# Patient Record
Sex: Male | Born: 1940 | Race: White | Hispanic: No | Smoking: Never smoker
Health system: Southern US, Community
[De-identification: ages and names within clinical notes are randomized; demographics above are authoritative.]

## PROBLEM LIST (undated history)

## (undated) DIAGNOSIS — M47812 Spondylosis without myelopathy or radiculopathy, cervical region: Secondary | ICD-10-CM

## (undated) DIAGNOSIS — J449 Chronic obstructive pulmonary disease, unspecified: Secondary | ICD-10-CM

## (undated) DIAGNOSIS — F419 Anxiety disorder, unspecified: Secondary | ICD-10-CM

## (undated) DIAGNOSIS — N200 Calculus of kidney: Secondary | ICD-10-CM

## (undated) DIAGNOSIS — E785 Hyperlipidemia, unspecified: Secondary | ICD-10-CM

## (undated) DIAGNOSIS — C439 Malignant melanoma of skin, unspecified: Secondary | ICD-10-CM

## (undated) DIAGNOSIS — I10 Essential (primary) hypertension: Secondary | ICD-10-CM

## (undated) DIAGNOSIS — R9431 Abnormal electrocardiogram [ECG] [EKG]: Secondary | ICD-10-CM

## (undated) DIAGNOSIS — K219 Gastro-esophageal reflux disease without esophagitis: Secondary | ICD-10-CM

## (undated) DIAGNOSIS — I2510 Atherosclerotic heart disease of native coronary artery without angina pectoris: Secondary | ICD-10-CM

## (undated) HISTORY — DX: Calculus of kidney: N20.0

## (undated) HISTORY — DX: Essential (primary) hypertension: I10

## (undated) HISTORY — PX: BASAL CELL CARCINOMA EXCISION: SHX1214

## (undated) HISTORY — PX: KIDNEY STONE SURGERY: SHX686

## (undated) HISTORY — PX: MELANOMA EXCISION: SHX5266

## (undated) HISTORY — DX: Spondylosis without myelopathy or radiculopathy, cervical region: M47.812

## (undated) HISTORY — DX: Abnormal electrocardiogram (ECG) (EKG): R94.31

## (undated) HISTORY — DX: Gastro-esophageal reflux disease without esophagitis: K21.9

## (undated) HISTORY — DX: Atherosclerotic heart disease of native coronary artery without angina pectoris: I25.10

## (undated) HISTORY — DX: Hyperlipidemia, unspecified: E78.5

## (undated) HISTORY — DX: Malignant melanoma of skin, unspecified: C43.9

## (undated) HISTORY — PX: LUMBAR DISC SURGERY: SHX700

## (undated) HISTORY — PX: HERNIA REPAIR: SHX51

## (undated) HISTORY — PX: CARDIAC CATHETERIZATION: SHX172

## (undated) HISTORY — PX: TONSILLECTOMY: SUR1361

---

## 2000-02-22 ENCOUNTER — Ambulatory Visit (HOSPITAL_COMMUNITY): Admission: RE | Admit: 2000-02-22 | Discharge: 2000-02-22 | Payer: Self-pay | Admitting: Cardiology

## 2001-03-05 ENCOUNTER — Other Ambulatory Visit: Admission: RE | Admit: 2001-03-05 | Discharge: 2001-03-05 | Payer: Self-pay | Admitting: Family Medicine

## 2006-04-01 ENCOUNTER — Ambulatory Visit: Payer: Self-pay | Admitting: Internal Medicine

## 2006-05-01 ENCOUNTER — Ambulatory Visit: Payer: Self-pay | Admitting: Internal Medicine

## 2007-08-27 ENCOUNTER — Encounter: Payer: Self-pay | Admitting: Internal Medicine

## 2007-08-27 DIAGNOSIS — J45998 Other asthma: Secondary | ICD-10-CM | POA: Insufficient documentation

## 2007-08-27 DIAGNOSIS — J3089 Other allergic rhinitis: Secondary | ICD-10-CM

## 2007-08-27 DIAGNOSIS — J4489 Other specified chronic obstructive pulmonary disease: Secondary | ICD-10-CM | POA: Insufficient documentation

## 2007-08-27 DIAGNOSIS — J302 Other seasonal allergic rhinitis: Secondary | ICD-10-CM

## 2007-08-27 DIAGNOSIS — J449 Chronic obstructive pulmonary disease, unspecified: Secondary | ICD-10-CM

## 2007-11-11 ENCOUNTER — Ambulatory Visit: Payer: Self-pay | Admitting: Internal Medicine

## 2008-11-16 ENCOUNTER — Ambulatory Visit: Payer: Self-pay | Admitting: Internal Medicine

## 2009-01-12 ENCOUNTER — Encounter: Admission: RE | Admit: 2009-01-12 | Discharge: 2009-01-12 | Payer: Self-pay | Admitting: Cardiology

## 2009-01-12 ENCOUNTER — Ambulatory Visit: Payer: Self-pay | Admitting: Vascular Surgery

## 2009-03-14 ENCOUNTER — Encounter: Payer: Self-pay | Admitting: Internal Medicine

## 2009-05-09 ENCOUNTER — Encounter: Admission: RE | Admit: 2009-05-09 | Discharge: 2009-08-07 | Payer: Self-pay | Admitting: Family Medicine

## 2009-06-12 ENCOUNTER — Inpatient Hospital Stay (HOSPITAL_COMMUNITY): Admission: RE | Admit: 2009-06-12 | Discharge: 2009-06-13 | Payer: Self-pay | Admitting: Neurological Surgery

## 2009-07-05 ENCOUNTER — Encounter: Payer: Self-pay | Admitting: Internal Medicine

## 2009-08-08 ENCOUNTER — Encounter: Admission: RE | Admit: 2009-08-08 | Discharge: 2009-11-06 | Payer: Self-pay | Admitting: Family Medicine

## 2009-11-16 ENCOUNTER — Telehealth: Payer: Self-pay | Admitting: Internal Medicine

## 2009-11-22 ENCOUNTER — Encounter: Payer: Self-pay | Admitting: Internal Medicine

## 2009-11-22 ENCOUNTER — Ambulatory Visit: Payer: Self-pay | Admitting: Internal Medicine

## 2009-12-07 ENCOUNTER — Encounter: Payer: Self-pay | Admitting: Internal Medicine

## 2009-12-07 LAB — CONVERTED CEMR LAB

## 2009-12-19 ENCOUNTER — Telehealth (INDEPENDENT_AMBULATORY_CARE_PROVIDER_SITE_OTHER): Payer: Self-pay | Admitting: *Deleted

## 2010-02-05 ENCOUNTER — Encounter: Admission: RE | Admit: 2010-02-05 | Discharge: 2010-02-05 | Payer: Self-pay | Admitting: Neurological Surgery

## 2010-02-07 ENCOUNTER — Encounter
Admission: RE | Admit: 2010-02-07 | Discharge: 2010-05-03 | Payer: Self-pay | Source: Home / Self Care | Attending: Neurological Surgery | Admitting: Neurological Surgery

## 2010-06-05 NOTE — Progress Notes (Signed)
Summary: nos appt  Phone Note Call from Patient   Caller: [email or web address]  Call For: Rita Prom Summary of Call: Rsc nos from 7/13 to 7/20 @ 9:45a, pt states he simply forgot and he asks that you "please forgive him." Initial call taken by: Darletta Moll,  November 16, 2009 9:21 AM

## 2010-06-05 NOTE — Assessment & Plan Note (Signed)
Summary: rov/jd   Primary Provider/Referring Provider:  [facility]  CC:  Follow up visit-asthma, allergies, and COPD.Marland Kitchen  History of Present Illness: History of Present Illness: 11/11/07- 70 year old man returns for follow-up of allergic rhinitis and asthma with COPD.  He reports a good year.  Follow-up with no major problems.  He is a Sport and exercise psychologist.  Ventolin helps.  Had follow-up cardiac evaluation and is told he had  small blockages, mild cardiac enlargement.  Pulmonary function tests in 2700, showing small airways obstruction.  He has never smoked, but has history of passive smoke exposure.  Brother and father both died of lung cancer.  He tries to walk 5 days per week on a treadmill.  Asks renewal of his Henreitta Leber to Access ventolin support.  11/16/08- allergic trhinitis, asthma/ COPD Uneventful year from respiratory standpoint. Recent trip to [place] left him with a little throat clearing, and his singing is affected with voice breaks and difficulty at higher registers, but not sore throat, headache, sinus drainage, fever, wheeze, reflux. He does feel some pndrip.Marland Kitchen He golfs and is on treadmill several days a week.  Wife wanted him to mention that he falls asleep easily watching TV. He feels he sleeps well, averaging 7- 8 hrs sleep. Not told that he is active in sleep or that he snores.  05-Dec-2009- Allergic rhinitis, asthma/ COPD Breathing has done well. Had surgery for lumbar disk - anesthesia made his mood  depressed,  but had no respiratory complication. Had vocal cords checked by Dr Georgette Dover- hx of singing. Told vocal cords ok. He shows me "Entertainer's Secret" throat spray with aloe that looks benign. It also helps him to use the Ventolin about 2 hours before he sings.     Asthma History    Initial Asthma Severity Rating:    Age range: 12+ years    Symptoms: 0-2 days/week    Nighttime Awakenings: 0-2/month    Interferes w/ normal activity: no limitations    SABA use (not for  EIB): 0-2 days/week    Asthma Severity Assessment: Intermittent   Preventive Screening-Counseling & Management  Alcohol-Tobacco     Smoking Status: never  Current Medications (verified): 1)  Avapro 300 Mg  Tabs (Irbesartan) .... 1/2 By Mouth Once Daily 2)  Nexium 40 Mg  Cpdr (Esomeprazole Magnesium) .... Once Daily 3)  Zyrtec Allergy 10 Mg  Tabs (Cetirizine Hcl) .... Once Daily 4)  Celebrex 200 Mg  Caps (Celecoxib) .... Once Daily 5)  Cardura 4 Mg  Tabs (Doxazosin Mesylate) .... Once Daily 6)  Adult Aspirin Low Strength 81 Mg  Tbdp (Aspirin) .... Take 2 Tablets By Mouth Once Daily 7)  Niaspan 500 Mg  Tbcr (Niacin (Antihyperlipidemic)) .... Take One Tablet By Mouth Once Daily With Asa 8)  Ventolin Hfa 108 (90 Base) Mcg/act  Aers (Albuterol Sulfate) .... As Needed 9)  Crestor 5 Mg Tabs (Rosuvastatin Calcium) .... Take 1 By Mouth Once Daily 4 Times A Week  Allergies (verified): No Known Drug Allergies  Past History:  Past Medical History: Last updated: 08/27/2007 ALLERGIC RHINITIS (ICD-477.9) COPD (ICD-496) ASTHMA (ICD-493.90)  Family History: Last updated: December 05, 2009 Brother- smoked- died of lung cancer Father- died met prostate cancer.  Social History: Last updated: 11/11/2007 Patient never smoked.  Positive history of passive tobacco smoke exposure.   Risk Factors: Smoking Status: never (2009/12/05) Passive Smoke Exposure: yes (11/11/2007)  Past Surgical History: Tonsils removed Hernia repair Colonoscopy  Resection of a basal cell Cancer  Heart Cath x 2  Lumbar disk - 2011  Family History: Brother- smoked- died of lung cancer Father- died met prostate cancer.  Review of Systems      See HPI  The patient denies shortness of breath with activity, shortness of breath at rest, productive cough, non-productive cough, coughing up blood, chest pain, irregular heartbeats, acid heartburn, indigestion, loss of appetite, weight change, abdominal pain, difficulty  swallowing, sore throat, tooth/dental problems, headaches, nasal congestion/difficulty breathing through nose, and sneezing.    Vital Signs:  Patient profile:   70 year old male Height:      75 inches Weight:      196 pounds BMI:     24.59 O2 Sat:      97 % on Room air Pulse rate:   60 / minute BP sitting:   132 / 86  (left arm) Cuff size:   regular  Vitals Entered By: Reynaldo Minium CMA (November 22, 2009 9:49 AM)  O2 Flow:  Room air CC: Follow up visit-asthma, allergies, COPD.   Physical Exam  Additional Exam:  General: A/Ox3; pleasant and cooperative, NAD, trim, healthy appearing SKIN: no rash, lesions NODES: no lymphadenopathy HEENT: [place]/AT, EOM- WNL, Conjuctivae- clear, PERRLA, TM-WNL, Nose- clear, Throat- clear and wnl, very minimally hoarse, no stridor NECK: Supple w/ fair ROM, JVD- none, normal carotid impulses w/o bruits Thyroid- normal to palpation CHEST: Clear to P&A HEART: RRR, no m/g/r heard ABDOMEN: Soft and nl;  ZOX:WRUE, nl pulses, no edema  NEURO: Grossly intact to observation      Impression & Recommendations:  Problem # 1:  COPD (ICD-496)  Mild chronic fixed asthma. We discussed his family hx and will check an a1AT level. Meds are adequate. His updated medication list for this problem includes:    Zyrtec Allergy 10 Mg Tabs (Cetirizine hcl) ..... Once daily  Orders: Est. Patient Level III (45409)  Problem # 2:  ALLERGIC RHINITIS (ICD-477.9)  Not a significant issue at this time. He has had more seasonal drainage in past years. His updated medication list for this problem includes:    Zyrtec Allergy 10 Mg Tabs (Cetirizine hcl) ..... Once daily  Medications Added to Medication List This Visit: 1)  Avapro 300 Mg Tabs (Irbesartan) .... 1/2 by mouth once daily 2)  Niaspan 500 Mg Tbcr (Niacin (antihyperlipidemic)) .... Take one tablet by mouth once daily with asa 3)  Crestor 5 Mg Tabs (Rosuvastatin calcium) .... Take 1 by mouth once daily 4 times a  week  Patient Instructions: 1)  Please schedule a follow-up appointment in 1 year. 2)  a1AT test kit

## 2010-06-05 NOTE — Progress Notes (Signed)
Summary: results  Phone Note Call from Patient Call back at Home Phone [phone]   Caller: Patient Call For: young Summary of Call: wants lab results.  Initial call taken by: Tivis Ringer, CNA,  December 19, 2009 12:29 PM  Follow-up for Phone Call        Spoke with pt-aware that MM results of Alpha 1 is normal.Katie [facility] CMA  December 19, 2009 4:30 PM

## 2010-06-05 NOTE — Miscellaneous (Signed)
Summary: Alpha 1 Antitrypsin Results  Clinical Lists Changes  Observations: Added new observation of A-1 ANTITRYP: MM (12/07/2009 9:18)

## 2010-06-05 NOTE — Miscellaneous (Signed)
Summary: Ventolin HFA Rx for assistance program/kcw  Clinical Lists Changes  Medications: Rx of VENTOLIN HFA 108 (90 BASE) MCG/ACT  AERS (ALBUTEROL SULFATE) as needed;  #3 x 3;  Signed;  Entered by: Reynaldo Minium CMA;  Authorized by: Waymon Budge MD;  Method used: Print then Give to Patient    Prescriptions: VENTOLIN HFA 108 (90 BASE) MCG/ACT  AERS (ALBUTEROL SULFATE) as needed  #3 x 3   Entered by:   Reynaldo Minium CMA   Authorized by:   Waymon Budge MD   Signed by:   Reynaldo Minium CMA on 07/05/2009   Method used:   Print then Give to Patient   RxID:   [id number]

## 2010-06-07 ENCOUNTER — Telehealth (INDEPENDENT_AMBULATORY_CARE_PROVIDER_SITE_OTHER): Payer: Self-pay | Admitting: *Deleted

## 2010-06-13 NOTE — Progress Notes (Signed)
Summary: pt assist program  Phone Note Call from Patient Call back at Home Phone [phone]   Caller: Patient Call For: young Summary of Call: pt wanted to leave a msg for libby re: papers that he sent to her/ pt assist. program. he says he left off the part re: # of dependents. it should read "2".  Initial call taken by: Tivis Ringer, CNA,  June 07, 2010 11:47 AM  Follow-up for Phone Call        spoke to pt when is paperwork get here i need to change his dependents to #2  Follow-up by: Oneita Jolly,  June 08, 2010 9:38 AM

## 2010-07-16 ENCOUNTER — Ambulatory Visit (INDEPENDENT_AMBULATORY_CARE_PROVIDER_SITE_OTHER): Payer: Medicare Other | Admitting: Cardiology

## 2010-07-16 DIAGNOSIS — E7800 Pure hypercholesterolemia, unspecified: Secondary | ICD-10-CM

## 2010-07-16 DIAGNOSIS — I2510 Atherosclerotic heart disease of native coronary artery without angina pectoris: Secondary | ICD-10-CM

## 2010-07-26 LAB — BASIC METABOLIC PANEL
BUN: 15 mg/dL (ref 6–23)
CO2: 30 mEq/L (ref 19–32)
Calcium: 9.8 mg/dL (ref 8.4–10.5)
Chloride: 100 mEq/L (ref 96–112)
Creatinine, Ser: 1.1 mg/dL (ref 0.4–1.5)
GFR calc Af Amer: >60 mL/min (ref >60)
GFR calc non Af Amer: >60 mL/min (ref >60)
Glucose, Bld: 111 mg/dL — ABNORMAL HIGH (ref 70–99)
Potassium: 4.9 mEq/L (ref 3.5–5.1)
Sodium: 138 mEq/L (ref 135–145)

## 2010-07-26 LAB — CBC
HCT: 47 % (ref 39.0–52.0)
Hemoglobin: 16.3 g/dL (ref 13.0–17.0)
MCHC: 34.6 g/dL (ref 30.0–36.0)
MCV: 95.3 fL (ref 78.0–100.0)
Platelets: 112 10*3/uL — ABNORMAL LOW (ref 150–400)
RBC: 4.93 MIL/uL (ref 4.22–5.81)
RDW: 12.4 % (ref 11.5–15.5)
WBC: 8.3 10*3/uL (ref 4.0–10.5)

## 2010-07-27 ENCOUNTER — Other Ambulatory Visit (HOSPITAL_COMMUNITY): Payer: Self-pay | Admitting: Cardiology

## 2010-07-27 DIAGNOSIS — I517 Cardiomegaly: Secondary | ICD-10-CM

## 2010-07-30 ENCOUNTER — Ambulatory Visit (HOSPITAL_COMMUNITY): Payer: Medicare Other | Attending: Cardiology | Admitting: Radiology

## 2010-07-30 ENCOUNTER — Ambulatory Visit (HOSPITAL_COMMUNITY): Payer: Medicare Other | Attending: Cardiology

## 2010-07-30 VITALS — Ht 75.0 in | Wt 191.0 lb

## 2010-07-30 DIAGNOSIS — J449 Chronic obstructive pulmonary disease, unspecified: Secondary | ICD-10-CM | POA: Insufficient documentation

## 2010-07-30 DIAGNOSIS — R9431 Abnormal electrocardiogram [ECG] [EKG]: Secondary | ICD-10-CM

## 2010-07-30 DIAGNOSIS — I2510 Atherosclerotic heart disease of native coronary artery without angina pectoris: Secondary | ICD-10-CM | POA: Insufficient documentation

## 2010-07-30 DIAGNOSIS — I517 Cardiomegaly: Secondary | ICD-10-CM

## 2010-07-30 DIAGNOSIS — R0609 Other forms of dyspnea: Secondary | ICD-10-CM

## 2010-07-30 DIAGNOSIS — J4489 Other specified chronic obstructive pulmonary disease: Secondary | ICD-10-CM | POA: Insufficient documentation

## 2010-07-30 MED ORDER — TECHNETIUM TC 99M TETROFOSMIN IV KIT
33.0000 | PACK | Freq: Once | INTRAVENOUS | Status: AC | PRN
  Administered 2010-07-30: 33 via INTRAVENOUS

## 2010-07-30 MED ORDER — TECHNETIUM TC 99M TETROFOSMIN IV KIT
11.0000 | PACK | Freq: Once | INTRAVENOUS | Status: AC | PRN
  Administered 2010-07-30: 11 via INTRAVENOUS

## 2010-07-30 NOTE — Progress Notes (Signed)
[facility] SITE 3 NUCLEAR MED [address] [place] [address] [phone]  Cardiology Nuclear Med Study Dave Cox male 01-25-1941   Nuclear Med Background Indication for Stress Test:  Evaluation for Ischemia History:  Echo and Heart Catheterization Cardiac Risk Factors: Family History - CAD, Hypertension and Lipids  Symptoms:  DOE and Fatigue   Nuclear Pre-Procedure Caffeine/Decaff Intake:  none NPO After: 7:30am   Lungs:  clear IV 0.9% NS with Angio Cath:  18g  IV Site: R Forearm  IV Started by:  Stanton Kidney, EMT-P  Chest Size (in):  44 Cup Size:   N/A  Height: 6\' 3"  (1.905 m)  Weight:  191 lb (86.637 kg)  BMI:  Body mass index is 23.87 kg/(m^2). Tech Comments:  N/A    Nuclear Med Study 1 or 2 day study: 1 day  Stress Test Type:  Stress  Reading MD: Kristeen Miss, MD  Order Authorizing Provider: S.Tennant  Resting Radionuclide: Technetium 64m Tetrofosmin  Resting Radionuclide Dose:11 mCi   Stress Radionuclide:  Technetium 23m Tetrofosmin  Stress Radionuclide Dose: 33  mCi           Stress Protocol Rest HR: 50 Stress HR: 88  Rest BP: 137/87 Stress BP: 213  Exercise Time: 9:00 min METS: 10.10  Predicted HR: 128 % of Maximum: 88        Dose of Adenosine:N/A  Dose of Lexiscan: N/A  Dose of Atropine: N/A Dose of Dobutamine: N/A  Stress Test Technologist: Milana Na, EMT-P  Nuclear Technologist:  Domenic Polite, CNMT     Rest Procedure:  Myocardial perfusion imaging was performed at rest 45 minutes following the intravenous administration of Technetium 63m Tetrofosmin. Rest ECG: Sinus Bradycardia  Stress Procedure:  The patient exercised for 9:00.  The patient stopped due to fatigue and denied any chest pain.  There were no significant ST-T wave changes and a rare pvc.  Technetium 22m Tetrofosmin was injected at peak exercise and myocardial perfusion imaging was performed after a brief delay. Stress ECG: No significant change  from baseline ECG  QPS Raw Data Images:  Normal; no motion artifact; normal heart/lung ratio. Stress Images:  Normal homogeneous uptake in all areas of the myocardium. Rest Images:  Normal homogeneous uptake in all areas of the myocardium. Subtraction (SDS):  No evidence of ischemia.  Findings Risk Category:  Normal nuclear study. Clinically Abnormal:  No Ischemia:  No Fixed Defect:  No LV Dysfunction:  No Transient Ischemic Dilatation (Normal <1.22): 1.01 Lung/Heart Ratio (Normal <0.45):  0.19  Quantitative Gated Spect Images QGS EDV:  113 ml QGS ESV:  63 ml QGS cine images:  The LV systolic function is mildly reduced. QGS EF:  44 %  Impression Exercise Capacity:  Good exercise capacity. BP Response:  Normal blood pressure response. Clinical Symptoms:  No chest pain. ECG Impression:  No significant ST segment change suggestive of ischemia. Comparison with Prior Nuclear Study: No previous nuclear study performed  Overall Impression:  Normal stress nuclear study.  There is no evidence of ischemia.  The LV function is mildly reduced.

## 2010-08-01 ENCOUNTER — Telehealth: Payer: Self-pay | Admitting: *Deleted

## 2010-08-01 NOTE — Telephone Encounter (Signed)
Message copied by Barnetta Hammersmith on Wed Aug 01, 2010  2:22 PM ------      Message from: Roger Shelter      Created: Wed Aug 01, 2010 10:37 AM       Mild LVH with mild left atrial enlargement.  Findings satisfactory

## 2010-08-01 NOTE — Telephone Encounter (Signed)
Pt notified of nuclear and echo results.  Pt will f/u in 6 months with Norma Fredrickson NP.

## 2010-08-09 ENCOUNTER — Encounter: Payer: Self-pay | Admitting: Cardiology

## 2010-08-13 ENCOUNTER — Encounter: Payer: Self-pay | Admitting: Cardiology

## 2010-08-13 ENCOUNTER — Ambulatory Visit (INDEPENDENT_AMBULATORY_CARE_PROVIDER_SITE_OTHER): Payer: Medicare Other | Admitting: Cardiology

## 2010-08-13 VITALS — BP 116/70 | HR 70 | Ht 75.0 in | Wt 193.0 lb

## 2010-08-13 DIAGNOSIS — I2510 Atherosclerotic heart disease of native coronary artery without angina pectoris: Secondary | ICD-10-CM | POA: Insufficient documentation

## 2010-08-13 NOTE — Progress Notes (Signed)
Subjective:   Dave Cox comes in today for a followup visit. His 2-D echocardiogram showed normal LV function. His stress Myoview showed an EF of 44% with no abnormal perfusion imaging. There was no evidence of ischemia. It is felt that the LV function by Myoview was an error and abnormally low.in general, Dave Cox continues to do well. He exercises regularly and has a good exercise habit not only for his cardiovascular system but also for his singing voice.  Current Outpatient Prescriptions  Medication Sig Dispense Refill  . Ascorbic Acid (VITAMIN C PO) Take 1,800 mg by mouth daily.        Marland Kitchen aspirin 325 MG tablet Take 325 mg by mouth daily.        Marland Kitchen aspirin 81 MG tablet Take 81 mg by mouth daily.        . B Complex-C (SUPER B COMPLEX PO) Take by mouth daily.        . celecoxib (CELEBREX) 200 MG capsule Take 200 mg by mouth daily.        . Cholecalciferol (VITAMIN D) 1000 UNITS capsule Take 1,000 Units by mouth daily.        Marland Kitchen doxazosin (CARDURA) 4 MG tablet Take 4 mg by mouth at bedtime.        Marland Kitchen esomeprazole (NEXIUM) 40 MG capsule Take 40 mg by mouth daily.        . fish oil-omega-3 fatty acids 1000 MG capsule Take 1 g by mouth daily.        . Flaxseed, Linseed, (FLAX SEED OIL PO) Take by mouth daily.        . irbesartan (AVAPRO) 150 MG tablet Take 150 mg by mouth daily.        Marland Kitchen LORATADINE PO Take by mouth as needed.        . Multiple Vitamin (MULTIVITAMIN) tablet Take 1 tablet by mouth daily.        . rosuvastatin (CRESTOR) 5 MG tablet Take 5 mg by mouth daily.        . vitamin E (VITAMIN E) 400 UNIT capsule Take 1 capsule (400 Units total) by mouth daily.  30 capsule      No Known Allergies  Patient Active Problem List  Diagnoses  . ALLERGIC RHINITIS  . ASTHMA  . COPD    History  Smoking status  . Never Smoker   Smokeless tobacco  . Not on file    History  Alcohol Use     Family History  Problem Relation Age of Onset  . Cancer Father   . Arthritis Father   . Hypertension  Father   . Arthritis Mother   . Heart disease Mother   . Hypertension Mother   . Stroke Mother     Review of Systems:   The patient denies any heat or cold intolerance.  No weight gain or weight loss.  The patient denies headaches or blurry vision.  There is no cough or sputum production.  The patient denies dizziness.  There is no hematuria or hematochezia.  The patient denies any muscle aches or arthritis.  The patient denies any rash.  The patient denies frequent falling or instability.  There is no history of depression or anxiety.  All other systems were reviewed and are negative.   Physical Exam:   Weight is 193. Blood pressure 116/70 sitting, heart rate 70.The head is normocephalic and atraumatic.  Pupils are equally round and reactive to light.  Sclerae nonicteric.  Conjunctiva is clear.  Oropharynx is unremarkable.  There's adequate oral airway.  Neck is supple there are no masses.  Thyroid is not enlarged.  There is no lymphadenopathy.  Lungs are clear.  Chest is symmetric.  Heart shows a regular rate and rhythm.  S1 and S2 are normal.  There is no murmur click or gallop.  Abdomen is soft normal bowel sounds.  There is no organomegaly.  Genital and rectal deferred.  Extremities are without edema.  Peripheral pulses are adequate.  Neurologically intact.  Full range of motion.  The patient is not depressed.  Skin is warm and dry.  Assessment / Plan:

## 2010-08-13 NOTE — Assessment & Plan Note (Signed)
Dave Cox has normal cardiac perfusion. There is a difference between his ejection fraction by echo and by nuclearbut overall, I think he has normal ejection fraction and is at relatively low risk. We will see him back in 6 months with followup of his abnormal EKG with chronic anterior T-wave changes, and lipids.I will ask for him to see Dr. Elease Hashimoto in followup with Lawson Fiscal.

## 2010-08-22 ENCOUNTER — Telehealth: Payer: Self-pay | Admitting: Cardiology

## 2010-09-18 NOTE — Procedures (Signed)
CAROTID DUPLEX EXAM   INDICATION:  Dizziness, vertigo.   HISTORY:  Diabetes:  No.  Cardiac:  No.  Hypertension:  Yes.  Smoking:  No.  Previous Surgery:  No.  CV History:  Vertigo.  Amaurosis Fugax No, Paresthesias No, Hemiparesis No                                       RIGHT             LEFT  Brachial systolic pressure:         135               125  Brachial Doppler waveforms:         WNL               WNL  Vertebral direction of flow:        Antegrade         Antegrade  DUPLEX VELOCITIES (cm/sec)  CCA peak systolic                   96                82  ECA peak systolic                   86                105  ICA peak systolic                   85                87  ICA end diastolic                   31                33  PLAQUE MORPHOLOGY:                  Calcific          Mixed  PLAQUE AMOUNT:                      Mild              Mild  PLAQUE LOCATION:                    Bifurcation  Bifurcation/proximal ICA   IMPRESSION:  1. Right internal carotid artery shows no evidence of stenosis,      however, mild plaque noted in bifurcation.  2. Left internal carotid artery shows evidence of 1-39% stenosis.   ___________________________________________  Quita Skye Hart [place], M.D.   AS/MEDQ  D:  01/12/2009  T:  01/12/2009  Job:  [id number]

## 2010-09-21 NOTE — Cardiovascular Report (Signed)
[place]  Patient:    JOURDEN, GILSON                      MRN: [id number] Proc. Date: 02/22/00 Adm. Date:  [id number] Disc. Date: [id number] Attending:  Eleanora Neighbor CC:         Elvina Sidle, M.D.  Monica Becton, M.D.   Cardiac Catheterization  HISTORY:  Mr. Felter is a pleasant 70 year old gentleman with a history of previous catheterization showing moderate atherosclerosis with a 50% tubular narrowing in its mid portion in the vicinity of the takeoff of the second diagonal and 40% diagonal without significant disease.  He had a 40% narrowing at the origin of the posterior descending.  He had normal ejection fraction. He presented after having an episode of recurrent chest pain and he had an abnormal exercise test.  He was referred for catheterization.  PROCEDURES PERFORMED:  Left heart catheterization with selective coronary angiography, left ventricular angiography.  TYPE AND SITE OF ENTRY:  Percutaneous right femoral artery with Perclose.  CONTRAST MATERIAL:  Omnipaque.  MEDICATIONS GIVE PRIOR TO PROCEDURE:  Valium 10 mg p.o.  MEDICATIONS GIVEN DURING PROCEDURE:  Versed 4 mg IV.  CATHETERS:  Six [place] #4 curved Judkins right and left coronary catheters, 6-French pigtail ventriculography catheter.  COMMENTS:  The patient tolerated the procedure well.  HEMODYNAMIC DATA:  The aortic pressure was 145/84, LV 135/15.  There is no aortic valve gradient noted on pullback.  ANGIOGRAPHIC DATA: 1.  The left main coronary artery was relatively long.  There are some     irregularities distally and approximately a 30% focal narrowing. 2.  The left circumflex is a relatively large vessel which bifurcates early.     The obtuse marginal has minor irregularities, but it is essentially     normal. 3.  The left anterior descending artery has 30% to 40% narrowing in a somewhat     segmental portion proximally.  There is a high  first obtuse marginal     vessel without significant compromise.  There are minor irregularities in     the remainder of the left anterior descending artery. 4.  The right coronary artery is a relatively large dominant vessel.  There is     30% to perhaps 40% narrowing in the right coronary artery before the crux.     At the level of the crux, there is 50% to 60% narrowing at the origin of     the posterior descending.  There are three to four other smaller     posterolateral branches that arise and are free of significant obstructive     disease.  LEFT VENTRICULOGRAM:  Left ventricular angiogram was performed in the RAO position.  The overall cardiac size and silhouette are normal.  A left ventricular ejection fraction was estimated to be 70%.  OVERALL IMPRESSION: 1.  Normal left ventricular function. 2.  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  DISCUSSION:  The disease is relatively diffuse and moderate in nature.  We will continue medical management and aggressive modification of cardiovascular risks. DD:  02/22/00 TD:  02/23/00 Job: [id number]

## 2010-09-21 NOTE — Assessment & Plan Note (Signed)
[facility]                             PULMONARY OFFICE NOTE   Dave Cox, STEPTER                      MRN:          [id number]  DATE:04/01/2006                            DOB:          [birth year]    PROBLEM:  Pulmonary consultation at the kind request of Dr. Christell Constant for  this 70 year old, never smoker, concerned about morning cough, post-  nasal drip and abnormal chest x-ray.   HISTORY OF PRESENT ILLNESS:  Chest x-ray's two years apart had been  interpreted as showing some chronic obstructive pulmonary disease.  He  had an office spirometry by Dr. Lowanda Foster and was told he did not have  COPD.  The results of this tracing described an FVC of 4.99 (114%), an  FEV1 of 2.84 (93%), ratio 0.83, FEF 25-75, 58% of predicted, suggesting  slowing in mid flow or small airways.  He complains that he coughs some  when he first goes to bed and when he wakes up in the morning.  It takes  a while to get his chest clear and he thinks he is having post-nasal  drip.  Nasacort had been tried for rhinorrhea without much success and  it seemed to bother his singing voice. He denies any dyspnea or wheeze  and is walking for 35 minutes daily.   MEDICATIONS:  1. Avapro 300 mg.  2. Nexium 40 mg.  3. Zyrtec 10 mg.  4. Celebrex 200 mg.  5. Cardura 4 mg.  6. Fish oil.  7. Vitamins.  8. Aspirin 81 mg.   ALLERGIES:  No medication allergy.   REVIEW OF SYSTEMS:  CONSTITUTIONAL:  Weight has been stable.  GASTROINTESTINAL:  He is aware of some reflux occasionally.  HEENT:  Post-nasal drainage and morning chest congestion.  PULMONARY:  He does  not wake short of breath during the night or strangling and is not being  told of significant snoring, saying he snores only when he is lying on  his back, which he avoids doing.  EXTREMITIES:  His feet do not swell.  There has been adenopathy or rash.   PAST MEDICAL HISTORY:  Heart catheterization x2, most recently by Dr.  Delfin Edis.  Both times were because of concerns about chest pain and  myocardial infarction was ruled out.  Seasonal rhinitis, not severe.  Esophageal reflux symptoms controlled by Nexium.   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  There is no history of deep venous thrombosis.   SOCIAL HISTORY:  He never smoked.  He is married with grown children.  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  No  significant occupational exposures.   FAMILY HISTORY:  A brother died of lung cancer and was a smoker.  Father  died of lung cancer, attributed to his prostate.  A brother died of  colon cancer.  A sister has diabetes and a history of cerebrovascular  accident.   PHYSICAL EXAMINATION:  VITAL SIGNS:  Weight 200 pounds.  Blood  pressure  126/74. Pulse regular 65.  Room air saturation 98%.  GENERAL APPEARANCE:  This is a tall, alert, comfortable-appearing man.  Body mass is appropriate to height.  HEENT/NECK:  There is substantial mucous bridging in the nares  bilaterally without post-nasal drainage.  Palate length is 3/4.  Visible  posterior pharyngeal wall is not inflamed.  Voice quality is normal with  no neck vein distention, thyromegaly or stridor.  CHEST:  Quite, clear lung fields, unlabored without rales, wheeze or  rhonchi.  HEART:  Sounds are regular without murmurs or gallops.  EXTREMITIES:  No cyanosis, clubbing or edema.   IMMUNIZATIONS:  Vaccination history: He had Pneumococcal vaccine three  years ago and has had flu vaccine this fall.   CLINICAL DATA:  Radiology:  Chest x-ray reports from studies done at  [facility] most recently in January 2006 describe changes of COPD  without infiltrate or effusion.  Mild spur formation on the thoracic  spine.   IMPRESSION:  1. There may be mild over-inflation and mild obstructive disease that       hopefully can be treated.  2. Allergic rhinitis, relatively mild.   PLAN:  1. Rescheduling pulmonary function testing and chest x-ray.  2. Patient will return after testing for followup.     Clinton D. Maple Hudson, MD, Tonny Bollman, FACP  Electronically Signed    CDY/MedQ  DD: 04/01/2006  DT: 04/02/2006  Job #: [id number]   cc:   Ernestina Penna, M.D.

## 2010-09-21 NOTE — Assessment & Plan Note (Signed)
[facility]                             PULMONARY OFFICE NOTE   Dave Cox, Dave Cox                      MRN:          [id number]  DATE:05/01/2006                            DOB:          1941-04-08    PROBLEM LIST:  1. Asthma with chronic obstructive pulmonary disease.  2. Allergic rhinitis.   HISTORY:  Dave Cox returns today reporting a mild cold which is  improving without need for special intervention.  He notices routine  morning cough with phlegm that clears later in the day.  His daughter  gave him a trial with her albuterol inhaler which he finds helps before  he sings.  He had been noticing problems holding and reaching notes.   We reviewed family history:  Brother died with lung cancer.  Father was  a smoker and probably had COPD.   MEDICATIONS:  1. Avapro 300 mg.  2. Nexium 40 mg.  3. Zyrtec 10 mg.  4. Celebrex 200 mg.  5. Nasacort AQ.  6. Cardura 4 mg.  7. Fish oil 1,000 mg.  8. Multivitamin.  9. Saw [place]met[place].  10.Aspirin 81 mg.   NO MEDICATION ALLERGY.   He exercises on a treadmill for 30-35 minutes 3-4 times per week.  Never  smoked himself.   OBJECTIVE:  VITAL SIGNS:  Weight 201 pounds, BP 132/80, pulse regular  74, room air saturation 96%.  GENERAL:  Speech is clear.  LUNGS:  Fields are clear.  Breathing is unlabored.  HEART:  Pulse is regular.  No edema.   Chest x-ray:  His film from April 01, 2006, showed no active  cardiopulmonary process with normal heart and mediastinum, clear lung  fields, osteophytes were noted on the thoracic spine.  There was no  change from 2006.   PFT:  Mild obstructive airway disease, mainly evidenced in small airway  flows where there was no response to bronchodilator.  His FEV1 was 3.35  (96%), FVC 5.33 (105%), ratio 0.63.  Measured lung volumes and diffusion  capacity were normal.   IMPRESSION:  1. Mild asthma with chronic obstructive pulmonary disease.  2. Mild allergic  rhinitis.   PLAN:  1. Albuterol rescue inhaler 2 puffs q.i.d. p.r.n. with particular      intent that he be able to use it if needed before singing.  2. He is returning his x-ray films to [facility].  3. Schedule return p.r.n.     Clinton D. Maple Hudson, MD, Tonny Bollman, FACP  Electronically Signed    CDY/MedQ  DD: 05/03/2006  DT: 05/03/2006  Job #: [id number]   cc:   Ernestina Penna, M.D.

## 2010-11-20 ENCOUNTER — Encounter: Payer: Self-pay | Admitting: Internal Medicine

## 2010-11-21 ENCOUNTER — Ambulatory Visit (INDEPENDENT_AMBULATORY_CARE_PROVIDER_SITE_OTHER): Payer: Medicare Other | Admitting: Internal Medicine

## 2010-11-21 ENCOUNTER — Telehealth: Payer: Self-pay | Admitting: Internal Medicine

## 2010-11-21 ENCOUNTER — Encounter: Payer: Self-pay | Admitting: Internal Medicine

## 2010-11-21 VITALS — BP 118/78 | HR 61 | Ht 75.0 in | Wt 194.8 lb

## 2010-11-21 DIAGNOSIS — J45909 Unspecified asthma, uncomplicated: Secondary | ICD-10-CM

## 2010-11-21 DIAGNOSIS — J309 Allergic rhinitis, unspecified: Secondary | ICD-10-CM

## 2010-11-21 MED ORDER — ALBUTEROL SULFATE HFA 108 (90 BASE) MCG/ACT IN AERS: 2.0000 | INHALATION_SPRAY | Freq: Four times a day (QID) | RESPIRATORY_TRACT | Status: DC | PRN

## 2010-11-21 MED ORDER — MOMETASONE FUROATE 50 MCG/ACT NA SUSP: 2.0000 | Freq: Every day | NASAL | Status: DC

## 2010-11-21 NOTE — Telephone Encounter (Signed)
PATIENT RETURNED CALL AND WANTS TO TALK TO KATIE.  PLEASE CALL AT [phone]

## 2010-11-21 NOTE — Assessment & Plan Note (Signed)
Nasonex has helped we- will refillll

## 2010-11-21 NOTE — Telephone Encounter (Signed)
Called, spoke with pt.  States during OV today, CDY had mentioned pt could try another inhaler in place of the ventolin that would not cause hoarseness as much.  Pt did not know the name of this and I do not see the name of the inhaler mentioned in the note.  He would like to know if we have a sample of this inhaler CDY was talking about for him to try to see if it is better than the ventolin.  He is aware CDY out of office this evening and ok with call back tomorrow.  Dr. Maple Hudson, pls advise.  Thanks!

## 2010-11-21 NOTE — Assessment & Plan Note (Addendum)
Good symptom control with occasional use of a rescue inhaler before singing. Steroid inhalers might cause more hoarseness than he wants.  Atrovent HFA might be a nonsteroidal alternative or supplement to albuterol if needed.

## 2010-11-21 NOTE — Patient Instructions (Signed)
Meds refilled.

## 2010-11-21 NOTE — Telephone Encounter (Signed)
LMTCB

## 2010-11-21 NOTE — Progress Notes (Signed)
Subjective:    Patient ID: Dave Cox, male    DOB: 05-02-1941, 70 y.o.   MRN: [id number]  HPI 11/21/10- 69 yoM never smoker, followed for chronic obstructive asthma, allergic rhinitis, complicated by CAD   Dave Cox. Last here November 22, 2009- allergic rhinits, obstructive asthma Dave Cox- uses "Entertainer's Secret" spray for throat. 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 Nasonex sample helped postnasal drip. Continues Nexium, but ENT suggested he try taking it twice daily. Younger brother died of lung cancer- former smoker. CXR 06/12/09- clear lungs.   Review of Systems Constitutional:   No-   weight loss, night sweats, fevers, chills, fatigue, lassitude. HEENT:   No-   headaches, difficulty swallowing, tooth/dental problems, sore throat,                  No-   sneezing, itching, ear ache,    CV:  No-   chest pain, orthopnea, PND, swelling in lower extremities, anasarca,                                  dizziness, palpitations  GI:  No-   heartburn, indigestion, abdominal pain, nausea, vomiting, diarrhea,                 change in bowel habits, loss of appetite  Resp: No-   shortness of breath with exertion or at rest.  No-  excess mucus,             No-   productive cough,  No non-productive cough,  No-  coughing up of blood.              No-   change in color of mucus.  No- wheezing.    Skin: No-   rash or lesions.  GU: No-   dysuria, change in color of urine, no urgency or frequency.  No- flank pain.  MS:  No-   joint pain or swelling.  No- decreased range of motion.  No- back pain.  Psych:  No- change in mood or affect. No depression or anxiety.  No memory loss.      Objective:   Physical Exam General- Alert, Oriented, Affect-appropriate, Distress- none acute  Tall man Skin- rash-none, lesions- none, excoriation- none Lymphadenopathy- none Head- atraumatic            Eyes- Gross vision intact, PERRLA, conjunctivae clear secretions            Ears-  Hearing, canals            Nose- Clear, no- Septal dev, mucus, polyps, erosion, perforation             Throat- Mallampati II , mucosa clear , drainage- none, tonsils- atrophic Neck- flexible , trachea midline, no stridor , thyroid nl, carotid no bruit Chest - symmetrical excursion , unlabored           Heart/CV- RRR , no murmur , no gallop  , no rub, nl s1 s2                           - JVD- none , edema- none, stasis changes- none, varices- none           Lung- clear to P&A, wheeze- none, cough- none , dullness-none, rub- none           Chest wall-  Abd- tender-no, distended-no, bowel sounds-present, HSM- no Br/ Gen/ Rectal- Not done, not indicated Extrem- cyanosis- none, clubbing, none, atrophy- none, strength- nl Neuro- grossly intact to observation         Assessment & Plan:

## 2010-11-22 MED ORDER — IPRATROPIUM BROMIDE HFA 17 MCG/ACT IN AERS: 2.0000 | INHALATION_SPRAY | RESPIRATORY_TRACT | Status: DC | PRN

## 2010-11-22 NOTE — Telephone Encounter (Signed)
Offer trial Atrovent HFA  Inhaler-  2 puffs every 4 hours if needed. # 1, ref prn. No samples available. This could be used instead of or in addition to his current Ventolin inhaler.

## 2010-11-22 NOTE — Telephone Encounter (Signed)
Called, spoke with pt.  He was informed to trial Atrovent HFA 2 puffs every 4 hours as needed.  He is aware this could be used instead of or in addition to his current Ventolin inhaler per CDY.  He verbalized understanding of these instructions.  He is aware no samples are available and requesting rx to be mailed -- rx printed and placed on CDY's cart for signature.

## 2010-11-22 NOTE — Telephone Encounter (Signed)
Rx signed by CY and placed in mail.

## 2010-12-04 NOTE — Telephone Encounter (Signed)
No note necessary for this encounter. °

## 2011-01-31 ENCOUNTER — Telehealth: Payer: Self-pay | Admitting: Internal Medicine

## 2011-01-31 NOTE — Telephone Encounter (Signed)
Spoke with patient-aware that patient assistance medication (spiriva) is at front for pick up.

## 2011-01-31 NOTE — Telephone Encounter (Signed)
LMOM for pt TCB.  Is he talking about patient assistant meds? Or samples?

## 2011-01-31 NOTE — Telephone Encounter (Signed)
Pt calling stating he received a letter from [employer] stating his meds shipped on 9/14 to our office.  Pt calling to see if we have received his meds yet in the mail.  Katie, Please advise. Pt states ok to leave message on his machine. Thanks.

## 2011-02-01 ENCOUNTER — Telehealth: Payer: Self-pay | Admitting: Internal Medicine

## 2011-02-01 ENCOUNTER — Telehealth: Payer: Self-pay | Admitting: Cardiovascular Disease

## 2011-02-01 MED ORDER — ALBUTEROL SULFATE HFA 108 (90 BASE) MCG/ACT IN AERS: 2.0000 | INHALATION_SPRAY | Freq: Four times a day (QID) | RESPIRATORY_TRACT | Status: DC | PRN

## 2011-02-01 NOTE — Telephone Encounter (Signed)
Pt is needing a new rx faxed to [facility] for Access for his Ventolin HFA. 90 day supply. RX has been printed and is awaiting CDY signature when back in the office on Mon., 10/1. Placed on CDY cart w/ fax cover sheet.

## 2011-02-01 NOTE — Telephone Encounter (Signed)
Pt needs refill of niaspan 2 a day 500 mg thru pt assistance-[facility]

## 2011-02-04 ENCOUNTER — Telehealth: Payer: Self-pay | Admitting: *Deleted

## 2011-02-04 NOTE — Telephone Encounter (Signed)
Rx for ventolin signed by CDY and faxed back to [facility] for Access.    I called and spoke with pt.  He is aware of above.  Pt states he turned in an application for pt assistance to [employer] for Atrovent HFA but someone crossed out the Atrovent HFA and wrote Spiriva.  He is currently not on spiriva.  The pt assistance shipment of Spiriva arrived here and was picked up by pt.  He has not opened it or started it yet.  He would like to know if CDY "changed his mind" about the Atrovent HFA and wanted him to do the Spiriva in its place or if this is an error.  Dr. Maple Hudson, I did not see a phone message in pt's chart regarding this change.  Do you recall anything regarding this?

## 2011-02-04 NOTE — Telephone Encounter (Signed)
Gweneth Dimitri, RN 02/04/2011 9:23 AM Signed  Rx for ventolin signed by CDY and faxed back to [facility] for Access.  I called and spoke with pt. He is aware of above. Pt states he turned in an application for pt assistance to [employer] for Atrovent HFA but someone crossed out the Atrovent HFA and wrote Spiriva. He is currently not on spiriva. The pt assistance shipment of Spiriva arrived here and was picked up by pt. He has not opened it or started it yet. He would like to know if CDY "changed his mind" about the Atrovent HFA and wanted him to do the Spiriva in its place or if this is an error. Dr. Maple Hudson, I did not see a phone message in pt's chart regarding this change. Do you recall anything regarding this?

## 2011-02-05 NOTE — Telephone Encounter (Signed)
Spoke to pt he is aware the wrong paperwork was filled out so new papers were sent to him to fill out for [facility] to access with return envelope

## 2011-02-05 NOTE — Telephone Encounter (Signed)
I don't see where I ordered Spiriva. We ordered Atrovent HFA. Bjorn Loser can you figure this one out?

## 2011-02-08 NOTE — Telephone Encounter (Signed)
Letter faxed yesterday to [facility].

## 2011-02-20 NOTE — Telephone Encounter (Signed)
Called pt niaspan arrived and will be in office pt aware and will get later in week

## 2011-03-05 ENCOUNTER — Telehealth: Payer: Self-pay | Admitting: Internal Medicine

## 2011-03-05 DIAGNOSIS — J309 Allergic rhinitis, unspecified: Secondary | ICD-10-CM

## 2011-03-06 MED ORDER — MOMETASONE FUROATE 50 MCG/ACT NA SUSP: 2.0000 | Freq: Every day | NASAL | Status: DC

## 2011-03-06 NOTE — Telephone Encounter (Signed)
New prescription printed and faxed to [employer] assistance program pharmacy at [phone] acct # [id number] Dahlia Byes.

## 2011-03-06 NOTE — Telephone Encounter (Signed)
Spoke with pt. He states needs 90 days supply nasonex printed and faxed to [employer] assistance program at [phone]. I have printed rx and written pt acct number and have placed on CDY cart to sign along with fax cover sheet.

## 2011-03-06 NOTE — Telephone Encounter (Signed)
PT RETURNED CALL . Dave Cox  °

## 2011-03-06 NOTE — Telephone Encounter (Signed)
lmomtcb  

## 2011-03-11 ENCOUNTER — Encounter: Payer: Self-pay | Admitting: Cardiovascular Disease

## 2011-03-11 ENCOUNTER — Ambulatory Visit (INDEPENDENT_AMBULATORY_CARE_PROVIDER_SITE_OTHER): Payer: Medicare Other | Admitting: Cardiovascular Disease

## 2011-03-11 VITALS — BP 146/94 | HR 83 | Ht 74.5 in | Wt 191.4 lb

## 2011-03-11 DIAGNOSIS — I2510 Atherosclerotic heart disease of native coronary artery without angina pectoris: Secondary | ICD-10-CM

## 2011-03-11 DIAGNOSIS — I10 Essential (primary) hypertension: Secondary | ICD-10-CM

## 2011-03-11 DIAGNOSIS — E785 Hyperlipidemia, unspecified: Secondary | ICD-10-CM

## 2011-03-11 NOTE — Progress Notes (Signed)
Dave Cox Date of Birth  [birth year] [facility] [address]    [address] [place], [place]  [address] [phone]  Fax  [phone]  History of Present Illness:  Dave Cox is a 70 y.o.  gentleman with a history of hypertension and mild coronary artery disease. He had a heart catheterization in the recent past which revealed fairly normal coronary arteries. He was also found to have left ventricular hypertrophy which explained his abnormal EKG - T-wave inversions.  He's had an echocardiogram performed in March of 2002 at which revealed normal left ventricular systolic function. He has mild left ventricular hypertrophy. The echo was otherwise fairly normal.  Since he was last seen by Dr. Deborah Chalk in March, 2012. He has remained very active. He's not had any episodes of chest pain or shortness of breath. He's been able to do all of his normal activities without any significant problems.  Current Outpatient Prescriptions on File Prior to Visit  Medication Sig Dispense Refill  . albuterol (VENTOLIN HFA) 108 (90 BASE) MCG/ACT inhaler Inhale 2 puffs into the lungs every 6 (six) hours as needed for wheezing or shortness of breath.  3 Inhaler  3  . Ascorbic Acid (VITAMIN C PO) Take by mouth daily. Taking 1,000mg       . aspirin 325 MG tablet Take 325 mg by mouth daily.        Marland Kitchen aspirin 81 MG tablet Take 81 mg by mouth daily.        . B Complex-C (SUPER B COMPLEX PO) Take by mouth daily.        . celecoxib (CELEBREX) 200 MG capsule Take 200 mg by mouth daily.        . Cholecalciferol (VITAMIN D) 1000 UNITS capsule Take 1,000 Units by mouth daily.        Marland Kitchen doxazosin (CARDURA) 4 MG tablet Take 4 mg by mouth at bedtime.        Marland Kitchen esomeprazole (NEXIUM) 40 MG capsule Take 40 mg by mouth daily.        . fish oil-omega-3 fatty acids 1000 MG capsule Take 1 g by mouth daily.        . Flaxseed, Linseed, (FLAX SEED OIL PO) Take by mouth daily.        . irbesartan (AVAPRO) 150 MG tablet Take 150 mg by  mouth daily.        Marland Kitchen LORATADINE PO Take by mouth as needed.        . Multiple Vitamin (MULTIVITAMIN) tablet Take 1 tablet by mouth daily.        . rosuvastatin (CRESTOR) 5 MG tablet Take 5 mg by mouth daily.        . vitamin E (VITAMIN E) 400 UNIT capsule Take 1 capsule (400 Units total) by mouth daily.  30 capsule     He is only taking 2 mg of Cardura.  No Known Allergies  Past Medical History  Diagnosis Date  . Cervical osteoarthritis   . CAD (coronary artery disease)   . HTN (hypertension)   . Nephrolithiasis   . Melanoma     eye  . GERD (gastroesophageal reflux disease)   . Hyperlipidemia   . Abnormal EKG     Past Surgical History  Procedure Date  . Cardiac catheterization   . Melanoma excision     eye  . Tonsillectomy   . Hernia repair   . Kidney stone surgery   . Lumbar disc surgery   . Basal cell  carcinoma excision     History  Smoking status  . Never Smoker   Smokeless tobacco  . 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 Prostate cancer Father     mets  . Arthritis Father   . Hypertension Father   . Arthritis Mother   . Heart disease Mother   . Hypertension Mother   . Stroke Mother   . Lung cancer Brother     was a smoker    Reviw of Systems:  Reviewed in the HPI.  All other systems are negative.  Physical Exam: BP 146/94  Pulse 83  Ht 6' 2.5" (1.892 m)  Wt 191 lb 6.4 oz (86.818 kg)  BMI 24.25 kg/m2 The patient is alert and oriented x 3.  The mood and affect are normal.   Skin: warm and dry.  Color is normal.    HEENT:   [place]/AT. Normal carotids, no JVD  Lungs: clear   Heart: RR, no murmurs    Abdomen: nontender, no bruits, no masses  Extremities:  No c/c/e  Neuro:  Non focal, gait is normal.   ECG: Sinus rhythm. He has T-wave inversions in lead 1 and aVL. The T-wave versions in leads V4 and V5 seen  previously have not resolved.  Assessment / Plan:

## 2011-03-11 NOTE — Patient Instructions (Addendum)
Your physician wants you to follow-up in: 6 month,  You will receive a reminder letter in the mail two months in advance. If you don't receive a letter, please call our office to schedule the follow-up appointment.  Your physician recommends that you return for lab work in: 6 months fasting labs  Take cardura 4mg  daily

## 2011-03-11 NOTE — Assessment & Plan Note (Signed)
We'll check a fasting lipid profile on him when I see him again in 6 weeks.

## 2011-03-11 NOTE — Assessment & Plan Note (Signed)
He seems to be doing fairly well. He recently cut his dose of Cardura because of a borderline low blood pressure. Now his blood pressure is back up to moderately elevated. We'll restart him back on Cardura 4 mg a day. I'll see him again in 6 months for followup office visit-sooner if needed.

## 2011-05-30 ENCOUNTER — Telehealth: Payer: Self-pay | Admitting: *Deleted

## 2011-05-30 NOTE — Telephone Encounter (Signed)
Opened in error

## 2011-06-19 ENCOUNTER — Telehealth: Payer: Self-pay | Admitting: *Deleted

## 2011-06-19 NOTE — Telephone Encounter (Signed)
Called pt/msg left that niasin samples from manufacturer arrived and are at desk for him to pick up.

## 2011-06-24 ENCOUNTER — Other Ambulatory Visit: Payer: Self-pay | Admitting: *Deleted

## 2011-06-24 MED ORDER — ALBUTEROL SULFATE HFA 108 (90 BASE) MCG/ACT IN AERS: 2.0000 | INHALATION_SPRAY | Freq: Four times a day (QID) | RESPIRATORY_TRACT | Status: DC | PRN

## 2011-06-24 NOTE — Telephone Encounter (Signed)
Rx printed,signed, and given back to Triana at her request.

## 2011-08-07 DIAGNOSIS — M47812 Spondylosis without myelopathy or radiculopathy, cervical region: Secondary | ICD-10-CM | POA: Diagnosis not present

## 2011-08-08 DIAGNOSIS — H019 Unspecified inflammation of eyelid: Secondary | ICD-10-CM | POA: Diagnosis not present

## 2011-08-08 DIAGNOSIS — H029 Unspecified disorder of eyelid: Secondary | ICD-10-CM | POA: Insufficient documentation

## 2011-08-08 DIAGNOSIS — Z85828 Personal history of other malignant neoplasm of skin: Secondary | ICD-10-CM | POA: Diagnosis not present

## 2011-08-08 DIAGNOSIS — H02059 Trichiasis without entropian unspecified eye, unspecified eyelid: Secondary | ICD-10-CM | POA: Diagnosis not present

## 2011-08-15 DIAGNOSIS — M47812 Spondylosis without myelopathy or radiculopathy, cervical region: Secondary | ICD-10-CM | POA: Diagnosis not present

## 2011-08-19 DIAGNOSIS — H019 Unspecified inflammation of eyelid: Secondary | ICD-10-CM | POA: Diagnosis not present

## 2011-08-19 DIAGNOSIS — Z85828 Personal history of other malignant neoplasm of skin: Secondary | ICD-10-CM | POA: Diagnosis not present

## 2011-08-19 DIAGNOSIS — H029 Unspecified disorder of eyelid: Secondary | ICD-10-CM | POA: Diagnosis not present

## 2011-08-19 DIAGNOSIS — D2310 Other benign neoplasm of skin of unspecified eyelid, including canthus: Secondary | ICD-10-CM | POA: Diagnosis not present

## 2011-09-05 DIAGNOSIS — M47812 Spondylosis without myelopathy or radiculopathy, cervical region: Secondary | ICD-10-CM | POA: Diagnosis not present

## 2011-09-10 ENCOUNTER — Ambulatory Visit (INDEPENDENT_AMBULATORY_CARE_PROVIDER_SITE_OTHER): Payer: Medicare Other | Admitting: Cardiovascular Disease

## 2011-09-10 ENCOUNTER — Encounter: Payer: Self-pay | Admitting: Cardiovascular Disease

## 2011-09-10 VITALS — BP 130/75 | HR 57 | Ht 74.5 in | Wt 193.4 lb

## 2011-09-10 DIAGNOSIS — E785 Hyperlipidemia, unspecified: Secondary | ICD-10-CM

## 2011-09-10 DIAGNOSIS — E786 Lipoprotein deficiency: Secondary | ICD-10-CM

## 2011-09-10 DIAGNOSIS — I2510 Atherosclerotic heart disease of native coronary artery without angina pectoris: Secondary | ICD-10-CM

## 2011-09-10 LAB — HEPATIC FUNCTION PANEL
ALT: 22 U/L (ref 0–53)
AST: 27 U/L (ref 0–37)
Albumin: 4.2 g/dL (ref 3.5–5.2)
Alkaline Phosphatase: 61 U/L (ref 39–117)
Bilirubin, Direct: 0 mg/dL (ref 0.0–0.3)
Total Bilirubin: 0.9 mg/dL (ref 0.3–1.2)
Total Protein: 6.9 g/dL (ref 6.0–8.3)

## 2011-09-10 LAB — LIPID PANEL
Cholesterol: 135 mg/dL (ref 0–200)
HDL: 38.9 mg/dL — ABNORMAL LOW (ref >39.00)
LDL Cholesterol: 72 mg/dL (ref 0–99)
Total CHOL/HDL Ratio: 3
Triglycerides: 120 mg/dL (ref 0.0–149.0)
VLDL: 24 mg/dL (ref 0.0–40.0)

## 2011-09-10 LAB — BASIC METABOLIC PANEL
BUN: 15 mg/dL (ref 6–23)
CO2: 29 mEq/L (ref 19–32)
Calcium: 9.3 mg/dL (ref 8.4–10.5)
Creatinine, Ser: 1.1 mg/dL (ref 0.4–1.5)
GFR: 74.08 mL/min (ref >60.00)
Glucose, Bld: 97 mg/dL (ref 70–99)
Potassium: 4.4 mEq/L (ref 3.5–5.1)

## 2011-09-10 NOTE — Patient Instructions (Signed)
Your physician recommends that you return for a FASTING lipid profile: today   Your physician wants you to follow-up in: 6 months  You will receive a reminder letter in the mail two months in advance. If you don't receive a letter, please call our office to schedule the follow-up appointment.   

## 2011-09-10 NOTE — Progress Notes (Signed)
Dave Cox Date of Birth  [birth year]       [facility]    [place] [address], [address]  [address], [address] [place], [place]  [address]   [place], [place]  [address] [phone]     [phone]   Fax  [phone]    Fax [phone]  Problem List: 1. Hypertension 2. Mild coronary artery disease 3. Hyperlipidemia  History of Present Illness:  Dave Cox is a 71 y.o. gentleman with a history of hypertension and mild coronary artery disease. He had a heart catheterization in the recent past which revealed fairly normal coronary arteries. He was also found to have left ventricular hypertrophy which explained his abnormal EKG - T-wave inversions.  He's had an echocardiogram performed in March of 2002 at which revealed normal left ventricular systolic function. He has mild left ventricular hypertrophy. The echo was otherwise fairly normal.  He has had an episode of orthostatic hypotension.  He's anxious about the fact that several family members have had coronary artery bypass grafting at age 54. His lipid levels have been fairly well controlled. We will draw his lipid levels today.   Current Outpatient Prescriptions on File Prior to Visit  Medication Sig Dispense Refill  . albuterol (VENTOLIN HFA) 108 (90 BASE) MCG/ACT inhaler Inhale 2 puffs into the lungs 4 (four) times daily as needed for wheezing or shortness of breath.  3 Inhaler  3  . Ascorbic Acid (VITAMIN C PO) Take by mouth daily. Taking 1,000mg       . aspirin 325 MG tablet Take 325 mg by mouth daily.        Marland Kitchen aspirin 81 MG tablet Take 81 mg by mouth daily.        . B Complex-C (SUPER B COMPLEX PO) Take by mouth daily.        . celecoxib (CELEBREX) 200 MG capsule Take 200 mg by mouth daily.        . Cholecalciferol (VITAMIN D) 1000 UNITS capsule Take 1,000 Units by mouth daily.        Marland Kitchen doxazosin (CARDURA) 4 MG tablet Take 4 mg by mouth at bedtime.        Marland Kitchen esomeprazole (NEXIUM) 40 MG capsule  Take 40 mg by mouth daily.        . fish oil-omega-3 fatty acids 1000 MG capsule Take 1 g by mouth daily.        . Flaxseed, Linseed, (FLAX SEED OIL PO) Take by mouth daily.        . irbesartan (AVAPRO) 150 MG tablet Take 150 mg by mouth daily.        Marland Kitchen LORATADINE PO Take by mouth as needed.        . mometasone (NASONEX) 50 MCG/ACT nasal spray Place 1 spray into the nose daily.        . Multiple Vitamin (MULTIVITAMIN) tablet Take 1 tablet by mouth daily.        . niacin (NIASPAN) 1000 MG CR tablet Take 1,000 mg by mouth at bedtime.        . rosuvastatin (CRESTOR) 5 MG tablet Take 5 mg by mouth daily.        . vitamin E (VITAMIN E) 400 UNIT capsule Take 1 capsule (400 Units total) by mouth daily.  30 capsule    . DISCONTD: mometasone (NASONEX) 50 MCG/ACT nasal spray Place 2 sprays into the nose daily.  3 g  3    No Known  Allergies  Past Medical History  Diagnosis Date  . Cervical osteoarthritis   . CAD (coronary artery disease)   . HTN (hypertension)   . Nephrolithiasis   . Melanoma     eye  . GERD (gastroesophageal reflux disease)   . Hyperlipidemia   . Abnormal EKG     Past Surgical History  Procedure Date  . Cardiac catheterization   . Melanoma excision     eye  . Tonsillectomy   . Hernia repair   . Kidney stone surgery   . Lumbar disc surgery   . Basal cell carcinoma excision     History  Smoking status  . Never Smoker   Smokeless tobacco  . Not on file    History  Alcohol Use     Family History  Problem Relation Age of Onset  . Prostate cancer Father     mets  . Arthritis Father   . Hypertension Father   . Arthritis Mother   . Heart disease Mother   . Hypertension Mother   . Stroke Mother   . Lung cancer Brother     was a smoker    Reviw of Systems:  Reviewed in the HPI.  All other systems are negative.  Physical Exam: Blood pressure 130/75, pulse 57, height 6' 2.5" (1.892 m), weight 193 lb 6.4 oz (87.726 kg). General: Well developed, well  nourished, in no acute distress.  Head: Normocephalic, atraumatic, sclera non-icteric, mucus membranes are moist,   Neck: Supple. Carotids are 2 + without bruits. No JVD  Lungs: Clear bilaterally to auscultation.  Heart: regular rate.  normal  S1 S2. No murmurs, gallops or rubs.  Abdomen: Soft, non-tender, non-distended with normal bowel sounds. No hepatomegaly. No rebound/guarding. No masses.  Msk:  Strength and tone are normal  Extremities: No clubbing or cyanosis. No edema.  Distal pedal pulses are 2+ and equal bilaterally.  Neuro: Alert and oriented X 3. Moves all extremities spontaneously.  Psych:  Responds to questions appropriately with a normal affect.  ECG:  Assessment / Plan:

## 2011-09-10 NOTE — Assessment & Plan Note (Signed)
Dave Cox is doing well. He has mild coronary artery disease by cardiac catheterization. He reports having a cardiac catheterization 2012 but I am unable to find that report in the system.  He's not having any episodes of angina. We'll continue the same medications. We'll check fasting labs today. I'll see him again in 6 months and check fasting labs at that time.

## 2011-09-25 DIAGNOSIS — H02059 Trichiasis without entropian unspecified eye, unspecified eyelid: Secondary | ICD-10-CM | POA: Diagnosis not present

## 2011-09-25 DIAGNOSIS — H43819 Vitreous degeneration, unspecified eye: Secondary | ICD-10-CM | POA: Diagnosis not present

## 2011-09-25 DIAGNOSIS — H2510 Age-related nuclear cataract, unspecified eye: Secondary | ICD-10-CM | POA: Diagnosis not present

## 2011-10-24 DIAGNOSIS — I781 Nevus, non-neoplastic: Secondary | ICD-10-CM | POA: Diagnosis not present

## 2011-10-24 DIAGNOSIS — Z85828 Personal history of other malignant neoplasm of skin: Secondary | ICD-10-CM | POA: Diagnosis not present

## 2011-10-24 DIAGNOSIS — L821 Other seborrheic keratosis: Secondary | ICD-10-CM | POA: Diagnosis not present

## 2011-11-21 ENCOUNTER — Ambulatory Visit: Payer: Medicare Other | Admitting: Internal Medicine

## 2011-11-21 ENCOUNTER — Telehealth: Payer: Self-pay | Admitting: *Deleted

## 2011-11-21 NOTE — Telephone Encounter (Signed)
meds from [employer] patient assistance program arrived/ niaspan placed at front desk, pt informed.

## 2011-11-27 ENCOUNTER — Encounter: Payer: Self-pay | Admitting: Internal Medicine

## 2011-11-27 ENCOUNTER — Ambulatory Visit (INDEPENDENT_AMBULATORY_CARE_PROVIDER_SITE_OTHER)
Admission: RE | Admit: 2011-11-27 | Discharge: 2011-11-27 | Disposition: A | Discharge status: Home / Self Care | Payer: Medicare Other | Source: Ambulatory Visit | Attending: Internal Medicine | Admitting: Internal Medicine

## 2011-11-27 ENCOUNTER — Ambulatory Visit (INDEPENDENT_AMBULATORY_CARE_PROVIDER_SITE_OTHER): Payer: Medicare Other | Admitting: Internal Medicine

## 2011-11-27 VITALS — BP 122/82 | HR 60 | Ht 75.0 in | Wt 193.2 lb

## 2011-11-27 DIAGNOSIS — J309 Allergic rhinitis, unspecified: Secondary | ICD-10-CM | POA: Diagnosis not present

## 2011-11-27 DIAGNOSIS — J45998 Other asthma: Secondary | ICD-10-CM

## 2011-11-27 DIAGNOSIS — J42 Unspecified chronic bronchitis: Secondary | ICD-10-CM | POA: Diagnosis not present

## 2011-11-27 DIAGNOSIS — J45909 Unspecified asthma, uncomplicated: Secondary | ICD-10-CM | POA: Diagnosis not present

## 2011-11-27 DIAGNOSIS — J3089 Other allergic rhinitis: Secondary | ICD-10-CM

## 2011-11-27 MED ORDER — MOMETASONE FUROATE 50 MCG/ACT NA SUSP: 1.0000 | Freq: Every day | NASAL | Status: DC

## 2011-11-27 MED ORDER — DOXYCYCLINE HYCLATE 100 MG PO TABS: ORAL_TABLET | ORAL | Status: DC

## 2011-11-27 MED ORDER — ALBUTEROL SULFATE HFA 108 (90 BASE) MCG/ACT IN AERS: 2.0000 | INHALATION_SPRAY | Freq: Four times a day (QID) | RESPIRATORY_TRACT | Status: DC | PRN

## 2011-11-27 NOTE — Patient Instructions (Addendum)
Script doxycycline antibiotic   Refill Nasonex and albuterol rescue inhaler  Order- CXR dx chronic bronchitis

## 2011-11-27 NOTE — Progress Notes (Signed)
  Subjective:    Patient ID: Dave Cox, male    DOB: 02-22-1941, 71 y.o.   MRN: [id number]  HPI 11/21/10- 69 yoM never smoker, followed for chronic obstructive asthma, allergic rhinitis, complicated by CAD   Dave Cox. Last here November 22, 2009- allergic rhinits, obstructive asthma Dave Cox- uses "Entertainer's Secret" spray for throat. 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 Nasonex sample helped postnasal drip. Continues Nexium, but ENT suggested he try taking it twice daily. Younger brother died of lung cancer- former smoker. CXR 06/12/09- clear lungs.   11/27/11-  69 yoM never smoker, followed for chronic obstructive asthma, allergic rhinitis, complicated by CAD   Dave Cox. Last here November 22, 2009- allergic rhinits, obstructive asthma He uses rescue inhaler before singing. Nexium controls heartburn and Nasonex controls postnasal drip. He is satisfied. Father and brother died of lung cancer/smokers; another brother died of coronary disease.  ROS-see HPI Constitutional:   No-   weight loss, night sweats, fevers, chills, fatigue, lassitude. HEENT:   No-  headaches, difficulty swallowing, tooth/dental problems, sore throat,       No-  sneezing, itching, ear ache, nasal congestion, post nasal drip,  CV:  No-   chest pain, orthopnea, PND, swelling in lower extremities, anasarca, dizziness, palpitations Resp: No-   shortness of breath with exertion or at rest.              Mild  productive morning cough,  No non-productive cough,  No- coughing up of blood.              No-   change in color of mucus.  No- wheezing.   Skin: No-   rash or lesions. GI:  No-   heartburn, indigestion, abdominal pain, nausea, vomiting,  GU:  MS:  No-   joint pain or swelling.  . Neuro-     nothing unusual Psych:  No- change in mood or affect. No depression or anxiety.  No memory loss.  Objective:   Physical Exam General- Alert, Oriented, Affect-appropriate, Distress- none acute  Tall man Skin-  rash-none, lesions- none, excoriation- none Lymphadenopathy- none Head- atraumatic            Eyes- Gross vision intact, PERRLA, conjunctivae clear secretions            Ears- Hearing, canals            Nose- Clear, no- Septal dev, mucus, polyps, erosion, perforation             Throat- Mallampati II , mucosa clear , drainage- none, tonsils- atrophic Neck- flexible , trachea midline, no stridor , thyroid nl, carotid no bruit Chest - symmetrical excursion , unlabored           Heart/CV- RRR , no murmur , no gallop  , no rub, nl s1 s2                           - JVD- none , edema- none, stasis changes- none, varices- none           Lung- clear to P&A, wheeze- none, cough- none , dullness-none, rub- none           Chest wall-  Abd-  Br/ Gen/ Rectal- Not done, not indicated Extrem- cyanosis- none, clubbing, none, atrophy- none, strength- nl Neuro- grossly intact to observatio  Assessment & Plan:

## 2011-12-01 NOTE — Assessment & Plan Note (Signed)
Nasonex has been sufficient

## 2011-12-01 NOTE — Assessment & Plan Note (Signed)
Rescue inhaler before singing has been sufficient. He asks [facility] prescription for doxycycline We discussed chest x-ray because of his significant family history of respiratory disease

## 2011-12-03 ENCOUNTER — Telehealth: Payer: Self-pay | Admitting: Internal Medicine

## 2011-12-03 NOTE — Telephone Encounter (Signed)
Returned patients call, patient requesting CXR results.  Informed patient of results as listed below per Dr. Maple Hudson, patient verbalized understanding and had no further questions or concerns at this time.  Result Notes     Notes Recorded by Waymon Budge, MD on 11/27/2011 at 8:13 PM CXR- clear- heart and lungs look normal.

## 2011-12-04 NOTE — Progress Notes (Signed)
Quick Note:  Pt aware of results. ______ 

## 2012-01-06 ENCOUNTER — Other Ambulatory Visit: Payer: Self-pay | Admitting: Internal Medicine

## 2012-01-07 NOTE — Telephone Encounter (Signed)
Please advise if okay to refill. Thanks.  

## 2012-01-08 NOTE — Telephone Encounter (Signed)
Ok refill doxycycline

## 2012-02-17 ENCOUNTER — Telehealth: Payer: Self-pay | Admitting: *Deleted

## 2012-02-17 NOTE — Telephone Encounter (Signed)
Samples arrived from company for niaspan, pt informed.

## 2012-03-05 DIAGNOSIS — I10 Essential (primary) hypertension: Secondary | ICD-10-CM | POA: Diagnosis not present

## 2012-03-05 DIAGNOSIS — N400 Enlarged prostate without lower urinary tract symptoms: Secondary | ICD-10-CM | POA: Diagnosis not present

## 2012-03-05 DIAGNOSIS — E785 Hyperlipidemia, unspecified: Secondary | ICD-10-CM | POA: Diagnosis not present

## 2012-03-05 DIAGNOSIS — Z0000 Encounter for general adult medical examination without abnormal findings: Secondary | ICD-10-CM | POA: Diagnosis not present

## 2012-03-05 DIAGNOSIS — Z23 Encounter for immunization: Secondary | ICD-10-CM | POA: Diagnosis not present

## 2012-05-14 DIAGNOSIS — Z800 Family history of malignant neoplasm of digestive organs: Secondary | ICD-10-CM | POA: Diagnosis not present

## 2012-05-19 DIAGNOSIS — Z800 Family history of malignant neoplasm of digestive organs: Secondary | ICD-10-CM | POA: Diagnosis not present

## 2012-05-19 DIAGNOSIS — I10 Essential (primary) hypertension: Secondary | ICD-10-CM | POA: Diagnosis not present

## 2012-05-19 DIAGNOSIS — Z79899 Other long term (current) drug therapy: Secondary | ICD-10-CM | POA: Diagnosis not present

## 2012-05-19 DIAGNOSIS — Z87442 Personal history of urinary calculi: Secondary | ICD-10-CM | POA: Diagnosis not present

## 2012-05-19 DIAGNOSIS — Z85828 Personal history of other malignant neoplasm of skin: Secondary | ICD-10-CM | POA: Diagnosis not present

## 2012-05-19 DIAGNOSIS — K219 Gastro-esophageal reflux disease without esophagitis: Secondary | ICD-10-CM | POA: Diagnosis not present

## 2012-05-19 DIAGNOSIS — Z7982 Long term (current) use of aspirin: Secondary | ICD-10-CM | POA: Diagnosis not present

## 2012-05-19 DIAGNOSIS — Z1211 Encounter for screening for malignant neoplasm of colon: Secondary | ICD-10-CM | POA: Diagnosis not present

## 2012-06-02 ENCOUNTER — Other Ambulatory Visit: Payer: Self-pay | Admitting: Internal Medicine

## 2012-06-02 MED ORDER — ALBUTEROL SULFATE HFA 108 (90 BASE) MCG/ACT IN AERS: 2.0000 | INHALATION_SPRAY | Freq: Four times a day (QID) | RESPIRATORY_TRACT | Status: DC | PRN

## 2012-08-06 DIAGNOSIS — M25549 Pain in joints of unspecified hand: Secondary | ICD-10-CM | POA: Diagnosis not present

## 2012-08-11 DIAGNOSIS — M19049 Primary osteoarthritis, unspecified hand: Secondary | ICD-10-CM | POA: Diagnosis not present

## 2012-08-21 ENCOUNTER — Telehealth: Payer: Self-pay | Admitting: *Deleted

## 2012-08-21 NOTE — Telephone Encounter (Addendum)
Received paperwork for [facility], mailed out for niaspan 500 mg bid f/u app made for pt also.

## 2012-08-31 ENCOUNTER — Other Ambulatory Visit: Payer: Self-pay | Admitting: Family Medicine

## 2012-08-31 MED ORDER — CELECOXIB 200 MG PO CAPS: 200.0000 mg | ORAL_CAPSULE | Freq: Every day | ORAL | Status: DC

## 2012-08-31 MED ORDER — DOXAZOSIN MESYLATE 4 MG PO TABS: 4.0000 mg | ORAL_TABLET | Freq: Every day | ORAL | Status: DC

## 2012-08-31 NOTE — Telephone Encounter (Signed)
Pt on prescription assistance.  Needs refill orders to mail to [employer] to receive free meds.  RX printed for provider signature and mailed to patient

## 2012-09-07 DIAGNOSIS — H2510 Age-related nuclear cataract, unspecified eye: Secondary | ICD-10-CM | POA: Diagnosis not present

## 2012-09-07 DIAGNOSIS — H02059 Trichiasis without entropian unspecified eye, unspecified eyelid: Secondary | ICD-10-CM | POA: Diagnosis not present

## 2012-09-10 DIAGNOSIS — M19049 Primary osteoarthritis, unspecified hand: Secondary | ICD-10-CM | POA: Diagnosis not present

## 2012-09-14 ENCOUNTER — Telehealth: Payer: Self-pay | Admitting: *Deleted

## 2012-09-14 NOTE — Telephone Encounter (Signed)
NIASPAN ARRIVED FROM [employer]PT ASSISTANCE PROGRAM. MSG LEFT, MED AT FRONT DESK.

## 2012-10-27 ENCOUNTER — Ambulatory Visit (INDEPENDENT_AMBULATORY_CARE_PROVIDER_SITE_OTHER): Payer: Medicare Other | Admitting: Cardiovascular Disease

## 2012-10-27 ENCOUNTER — Encounter: Payer: Self-pay | Admitting: Cardiovascular Disease

## 2012-10-27 VITALS — BP 140/88 | HR 72 | Ht 75.0 in | Wt 191.8 lb

## 2012-10-27 DIAGNOSIS — I10 Essential (primary) hypertension: Secondary | ICD-10-CM | POA: Diagnosis not present

## 2012-10-27 DIAGNOSIS — E785 Hyperlipidemia, unspecified: Secondary | ICD-10-CM

## 2012-10-27 DIAGNOSIS — I2510 Atherosclerotic heart disease of native coronary artery without angina pectoris: Secondary | ICD-10-CM

## 2012-10-27 LAB — BASIC METABOLIC PANEL
CO2: 30 mEq/L (ref 19–32)
Calcium: 9.6 mg/dL (ref 8.4–10.5)
Chloride: 103 mEq/L (ref 96–112)
Glucose, Bld: 115 mg/dL — ABNORMAL HIGH (ref 70–99)
Sodium: 139 mEq/L (ref 135–145)

## 2012-10-27 LAB — LIPID PANEL
HDL: 42.1 mg/dL (ref >39.00)
LDL Cholesterol: 83 mg/dL (ref 0–99)
Total CHOL/HDL Ratio: 3
VLDL: 13.2 mg/dL (ref 0.0–40.0)

## 2012-10-27 LAB — HEPATIC FUNCTION PANEL
AST: 31 U/L (ref 0–37)
Albumin: 4.5 g/dL (ref 3.5–5.2)
Total Bilirubin: 1.4 mg/dL — ABNORMAL HIGH (ref 0.3–1.2)

## 2012-10-27 NOTE — Progress Notes (Signed)
Dave Cox Date of Birth  [birth year]       [facility]    [place] [address]  [address] [place], [place]  [address]   [place], [place]  [address] [phone]     [phone]   Fax  [phone]    Fax [phone]  Problem List: 1. Hypertension 2. Mild coronary artery disease 3. Hyperlipidemia  History of Present Illness:  Dave Cox is a 72 y.o. gentleman with a history of hypertension and mild coronary artery disease. He had a heart catheterization in the recent past which revealed fairly normal coronary arteries. He was also found to have left ventricular hypertrophy which explained his abnormal EKG - T-wave inversions.  He's had an echocardiogram performed in March of 2002 at which revealed normal left ventricular systolic function. He has mild left ventricular hypertrophy. The echo was otherwise fairly normal.  He has had an episode of orthostatic hypotension.  He's anxious about the fact that several family members have had coronary artery bypass grafting at age 32. His lipid levels have been fairly well controlled. We will draw his lipid levels today.  October 27, 2012  Dave Cox is doing well.  He is exercising 3 times a week without difficulty.   His BP at is typically well controlled.  He avoids salt.  He is taking vit B6 and B12.  He has a family hx of carotid disease.  He has had lifeline screening test and has been found to have no significant carotid disease.  We discussed that we did similar screening carotid dopplers.   Current Outpatient Prescriptions on File Prior to Visit  Medication Sig Dispense Refill  . albuterol (VENTOLIN HFA) 108 (90 BASE) MCG/ACT inhaler Inhale 2 puffs into the lungs 4 (four) times daily as needed for wheezing or shortness of breath.  3 Inhaler  3  . Ascorbic Acid (VITAMIN C PO) Take by mouth daily. Taking 1,000mg       . aspirin 325 MG tablet Take 325 mg by mouth daily.        Marland Kitchen aspirin 81 MG  tablet Take 81 mg by mouth daily.        . B Complex-C (SUPER B COMPLEX PO) Take by mouth daily.        . celecoxib (CELEBREX) 200 MG capsule Take 1 capsule (200 mg total) by mouth daily.  100 capsule  3  . Cholecalciferol (VITAMIN D) 1000 UNITS capsule Take 1,000 Units by mouth daily.        Marland Kitchen doxazosin (CARDURA) 4 MG tablet Take 1 tablet (4 mg total) by mouth at bedtime.  100 tablet  3  . doxycycline (VIBRA-TABS) 100 MG tablet TAKE TWO TABLETS BY MOUTH TODAY AND THEN ONE EVERY DAY  8 tablet  0  . esomeprazole (NEXIUM) 40 MG capsule Take 40 mg by mouth daily.        . fish oil-omega-3 fatty acids 1000 MG capsule Take 1 g by mouth daily.        . Flaxseed, Linseed, (FLAX SEED OIL PO) Take by mouth daily.        . irbesartan (AVAPRO) 150 MG tablet Take 150 mg by mouth daily.        Marland Kitchen LORATADINE PO Take by mouth as needed.        . mometasone (NASONEX) 50 MCG/ACT nasal spray Place 1 spray into the nose daily.  51 g  3  . Multiple Vitamin (MULTIVITAMIN)  tablet Take 1 tablet by mouth daily.        . niacin (NIASPAN) 1000 MG CR tablet Take 1,000 mg by mouth at bedtime.        . vitamin E (VITAMIN E) 400 UNIT capsule Take 1 capsule (400 Units total) by mouth daily.  30 capsule     No current facility-administered medications on file prior to visit.    No Known Allergies  Past Medical History  Diagnosis Date  . Cervical osteoarthritis   . CAD (coronary artery disease)   . HTN (hypertension)   . Nephrolithiasis   . Melanoma     eye  . GERD (gastroesophageal reflux disease)   . Hyperlipidemia   . Abnormal EKG     Past Surgical History  Procedure Laterality Date  . Cardiac catheterization    . Melanoma excision      eye  . Tonsillectomy    . Hernia repair    . Kidney stone surgery    . Lumbar disc surgery    . Basal cell carcinoma excision      History  Smoking status  . Never Smoker   Smokeless tobacco  . Not on file    History  Alcohol Use     Family History  Problem  Relation Age of Onset  . Prostate cancer Father     mets  . Arthritis Father   . Hypertension Father   . Arthritis Mother   . Heart disease Mother   . Hypertension Mother   . Stroke Mother   . Lung cancer Brother     was a smoker    Reviw of Systems:  Reviewed in the HPI.  All other systems are negative.  Physical Exam: Blood pressure 140/88, pulse 72, height 6\' 3"  (1.905 m), weight 191 lb 12.8 oz (87 kg). General: Well developed, well nourished, in no acute distress.  Head: Normocephalic, atraumatic, sclera non-icteric, mucus membranes are moist,   Neck: Supple. Carotids are 2 + without bruits. No JVD  Lungs: Clear bilaterally to auscultation.  Heart: regular rate.  normal  S1 S2. No murmurs, gallops or rubs.  Abdomen: Soft, non-tender, non-distended with normal bowel sounds. No hepatomegaly. No rebound/guarding. No masses.  Msk:  Strength and tone are normal  Extremities: No clubbing or cyanosis. No edema.  Distal pedal pulses are 2+ and equal bilaterally.  Neuro: Alert and oriented X 3. Moves all extremities spontaneously.  Psych:  Responds to questions appropriately with a normal affect.  ECG: October 27, 2012:  NSR at [employer] RBBB. NS t wave abn.   Assessment / Plan:

## 2012-10-27 NOTE — Assessment & Plan Note (Signed)
Dave Cox is stable.  Continue current meds

## 2012-10-27 NOTE — Assessment & Plan Note (Signed)
Lipids have been OK.  Will check lipids today and again at his next visit.

## 2012-10-27 NOTE — Patient Instructions (Addendum)
Fasting Lab today    Your physician wants you to follow-up in: 12 months with fasting lab You will receive a reminder letter in the mail two months in advance. If you don't receive a letter, please call our office to schedule the follow-up appointment.

## 2012-11-03 ENCOUNTER — Telehealth: Payer: Self-pay | Admitting: Family Medicine

## 2012-11-09 NOTE — Telephone Encounter (Signed)
Re ordered

## 2012-11-10 DIAGNOSIS — Z85828 Personal history of other malignant neoplasm of skin: Secondary | ICD-10-CM | POA: Diagnosis not present

## 2012-11-10 DIAGNOSIS — D485 Neoplasm of uncertain behavior of skin: Secondary | ICD-10-CM | POA: Diagnosis not present

## 2012-11-10 DIAGNOSIS — C44611 Basal cell carcinoma of skin of unspecified upper limb, including shoulder: Secondary | ICD-10-CM | POA: Diagnosis not present

## 2012-11-10 DIAGNOSIS — L821 Other seborrheic keratosis: Secondary | ICD-10-CM | POA: Diagnosis not present

## 2012-11-17 ENCOUNTER — Telehealth: Payer: Self-pay | Admitting: *Deleted

## 2012-11-17 NOTE — Telephone Encounter (Signed)
Spoke with wife/ samples from manufacture arrived and are at desk for pick up.

## 2012-11-26 DIAGNOSIS — M19049 Primary osteoarthritis, unspecified hand: Secondary | ICD-10-CM | POA: Diagnosis not present

## 2012-11-27 ENCOUNTER — Encounter: Payer: Self-pay | Admitting: Internal Medicine

## 2012-11-27 ENCOUNTER — Ambulatory Visit (INDEPENDENT_AMBULATORY_CARE_PROVIDER_SITE_OTHER)
Admission: RE | Admit: 2012-11-27 | Discharge: 2012-11-27 | Disposition: A | Discharge status: Home / Self Care | Payer: Medicare Other | Source: Ambulatory Visit | Attending: Internal Medicine | Admitting: Internal Medicine

## 2012-11-27 ENCOUNTER — Ambulatory Visit (INDEPENDENT_AMBULATORY_CARE_PROVIDER_SITE_OTHER): Payer: Medicare Other | Admitting: Internal Medicine

## 2012-11-27 VITALS — BP 142/90 | HR 65 | Ht 75.0 in | Wt 189.0 lb

## 2012-11-27 DIAGNOSIS — J45909 Unspecified asthma, uncomplicated: Secondary | ICD-10-CM | POA: Diagnosis not present

## 2012-11-27 DIAGNOSIS — I2510 Atherosclerotic heart disease of native coronary artery without angina pectoris: Secondary | ICD-10-CM

## 2012-11-27 DIAGNOSIS — J309 Allergic rhinitis, unspecified: Secondary | ICD-10-CM

## 2012-11-27 DIAGNOSIS — J45998 Other asthma: Secondary | ICD-10-CM

## 2012-11-27 DIAGNOSIS — J302 Other seasonal allergic rhinitis: Secondary | ICD-10-CM

## 2012-11-27 MED ORDER — MOMETASONE FUROATE 50 MCG/ACT NA SUSP: NASAL | Status: DC

## 2012-11-27 NOTE — Progress Notes (Signed)
Subjective:    Patient ID: Dave Cox, male    DOB: 25-Jan-1941, 72 y.o.   MRN: [id number]  HPI 11/21/10- 69 yoM never smoker, followed for chronic obstructive asthma, allergic rhinitis, complicated by CAD   Dave Cox. Last here November 22, 2009- allergic rhinits, obstructive asthma Dave Cox- uses "Entertainer's Secret" spray for throat. 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 Nasonex sample helped postnasal drip. Continues Nexium, but ENT suggested he try taking it twice daily. Younger brother died of lung cancer- former smoker. CXR 06/12/09- clear lungs.   11/27/11-  69 yoM never smoker, followed for chronic obstructive asthma, allergic rhinitis, complicated by CAD   Dave Cox. Last here November 22, 2009- allergic rhinits, obstructive asthma He uses rescue inhaler before singing. Nexium controls heartburn and Nasonex controls postnasal drip. He is satisfied. Father and brother died of lung cancer/smokers; another brother died of coronary disease.  Dec 15, 2012- 71 yoM never smoker, followed for chronic obstructive asthma, allergic rhinitis, complicated by CAD   Dave Cox.   Wife here Dave Cox FOR: no SOB, wheezing,congestion. has cough and gets strangled at times. Occasional cough especially if he drinks while sitting in his recliner. We discussed reflux and LPR management. 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 Satisfied with Nasonex-little postnasal drip. Takes many vitamin supplements. He is concerned about family history of lung cancer and we discussed CT versus chest x-ray and this never smoker. CXR 11/27/11 IMPRESSION:  Negative exam.  Original Report Authenticated By: Rosealee Albee, M.D.  ROS-see HPI Constitutional:   No-   weight loss, night sweats, fevers, chills, fatigue, lassitude. HEENT:   No-  headaches, difficulty swallowing, tooth/dental problems, sore throat,       No-  sneezing, itching, ear ache, nasal  congestion, post nasal drip,  CV:  No-   chest pain, orthopnea, PND, swelling in lower extremities, anasarca, dizziness, palpitations Resp: No-   shortness of breath with exertion or at rest.              Mild  productive morning cough,  + non-productive cough,  No- coughing up of blood.              No-   change in color of mucus.  No- wheezing.   Skin: No-   rash or lesions. GI:  No-   heartburn, indigestion, abdominal pain, nausea, vomiting,  GU:  MS:  No-   joint pain or swelling.  . Neuro-     nothing unusual Psych:  No- change in mood or affect. No depression or anxiety.  No memory loss.  Objective:   Physical Exam General- Alert, Oriented, Affect-appropriate, Distress- none acute  Tall man Skin- rash-none, lesions- none, excoriation- none Lymphadenopathy- none Head- atraumatic            Eyes- Gross vision intact, PERRLA, conjunctivae clear secretions            Ears- Hearing, canals            Nose- Clear, no- Septal dev, mucus, polyps, erosion, perforation             Throat- Mallampati II , mucosa clear , drainage- none, tonsils- atrophic, voice normal Neck- flexible , trachea midline, no stridor , thyroid nl, carotid no bruit Chest - symmetrical excursion , unlabored           Heart/CV- RRR , no murmur , no gallop  , no rub, nl  s1 s2                           - JVD- none , edema- none, stasis changes- none, varices- none           Lung- clear to P&A, wheeze- none, cough- none , dullness-none, rub- none           Chest wall-  Abd-  Br/ Gen/ Rectal- Not done, not indicated Extrem- cyanosis- none, clubbing, none, atrophy- none, strength- nl Neuro- grossly intact to observatio  Assessment & Plan:

## 2012-11-27 NOTE — Progress Notes (Signed)
Quick Note:  Spoke with patient, made patient aware of results as listed below per CY Patient verbalized understanding and nothing further needed at this time ______

## 2012-11-27 NOTE — Patient Instructions (Addendum)
Script refilling Nasonex with clarified instructions  Order- CXR   Dx asthma, CAD  Please call as needed

## 2012-12-12 NOTE — Assessment & Plan Note (Signed)
Nasonex seems sufficient. We discussed post nasal drip, voice quality and cough.

## 2012-12-12 NOTE — Assessment & Plan Note (Signed)
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 GERD may be more of a problem than asthma, affecting his speaking voice. We discussed possibility of ENT evaluation of his vocal cords. 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 Plan-chest x-ray

## 2012-12-21 ENCOUNTER — Telehealth: Payer: Self-pay | Admitting: Family Medicine

## 2012-12-21 NOTE — Telephone Encounter (Signed)
Pt only wants to speak to you. When I asked him if I could tell you what it was about, he said it was about a "product".

## 2012-12-22 MED ORDER — CELECOXIB 200 MG PO CAPS: 200.0000 mg | ORAL_CAPSULE | Freq: Every day | ORAL | Status: DC

## 2012-12-22 NOTE — Telephone Encounter (Signed)
Pt called early in July for refill of Celebrex to go to [employer] patient assistance.  States still has not received and [employer] says does not have request.  [employer] called again [phone]  (pt ID [phone])  And Celebrex was reordered for him  Confirmation number on order is [id number].  Pt aware.

## 2013-01-05 DIAGNOSIS — C44611 Basal cell carcinoma of skin of unspecified upper limb, including shoulder: Secondary | ICD-10-CM | POA: Diagnosis not present

## 2013-02-25 ENCOUNTER — Telehealth: Payer: Self-pay | Admitting: Family Medicine

## 2013-02-25 MED ORDER — CELECOXIB 200 MG PO CAPS: 200.0000 mg | ORAL_CAPSULE | Freq: Every day | ORAL | Status: DC

## 2013-02-25 NOTE — Telephone Encounter (Signed)
Meds refilled.

## 2013-02-25 NOTE — Telephone Encounter (Signed)
Patient needs a refill on his Celebrex  200mg    .  Pt. ID  [id number]  1- [phone].

## 2013-03-08 ENCOUNTER — Ambulatory Visit (INDEPENDENT_AMBULATORY_CARE_PROVIDER_SITE_OTHER): Payer: Medicare Other | Admitting: Family Medicine

## 2013-03-08 ENCOUNTER — Telehealth: Payer: Self-pay | Admitting: *Deleted

## 2013-03-08 ENCOUNTER — Other Ambulatory Visit: Payer: Self-pay | Admitting: Family Medicine

## 2013-03-08 ENCOUNTER — Encounter: Payer: Self-pay | Admitting: Family Medicine

## 2013-03-08 VITALS — BP 128/70 | HR 72 | Temp 97.3°F | Resp 14 | Ht 74.5 in | Wt 188.0 lb

## 2013-03-08 DIAGNOSIS — R7309 Other abnormal glucose: Secondary | ICD-10-CM | POA: Diagnosis not present

## 2013-03-08 DIAGNOSIS — E785 Hyperlipidemia, unspecified: Secondary | ICD-10-CM | POA: Diagnosis not present

## 2013-03-08 DIAGNOSIS — Z23 Encounter for immunization: Secondary | ICD-10-CM

## 2013-03-08 DIAGNOSIS — Z0000 Encounter for general adult medical examination without abnormal findings: Secondary | ICD-10-CM

## 2013-03-08 DIAGNOSIS — Z125 Encounter for screening for malignant neoplasm of prostate: Secondary | ICD-10-CM | POA: Diagnosis not present

## 2013-03-08 DIAGNOSIS — I10 Essential (primary) hypertension: Secondary | ICD-10-CM | POA: Diagnosis not present

## 2013-03-08 DIAGNOSIS — I2510 Atherosclerotic heart disease of native coronary artery without angina pectoris: Secondary | ICD-10-CM | POA: Diagnosis not present

## 2013-03-08 LAB — COMPLETE METABOLIC PANEL WITH GFR
ALT: 22 U/L (ref 0–53)
Albumin: 4.6 g/dL (ref 3.5–5.2)
CO2: 28 mEq/L (ref 19–32)
Calcium: 9.8 mg/dL (ref 8.4–10.5)
Chloride: 105 mEq/L (ref 96–112)
Creat: 1.04 mg/dL (ref 0.50–1.35)
GFR, Est African American: 83 mL/min
Potassium: 5.2 mEq/L (ref 3.5–5.3)
Total Protein: 6.8 g/dL (ref 6.0–8.3)

## 2013-03-08 LAB — CBC WITH DIFFERENTIAL/PLATELET
Basophils Absolute: 0 10*3/uL (ref 0.0–0.1)
Basophils Relative: 0 % (ref 0–1)
Eosinophils Absolute: 0.3 10*3/uL (ref 0.0–0.7)
Hemoglobin: 15.8 g/dL (ref 13.0–17.0)
MCH: 32.4 pg (ref 26.0–34.0)
MCHC: 35.7 g/dL (ref 30.0–36.0)
Monocytes Relative: 14 % — ABNORMAL HIGH (ref 3–12)
Neutrophils Relative %: 40 % — ABNORMAL LOW (ref 43–77)
RDW: 13.5 % (ref 11.5–15.5)

## 2013-03-08 LAB — LIPID PANEL
LDL Cholesterol: 72 mg/dL (ref 0–99)
Triglycerides: 55 mg/dL (ref <150)
VLDL: 11 mg/dL (ref 0–40)

## 2013-03-08 MED ORDER — DUTASTERIDE 0.5 MG PO CAPS: 0.5000 mg | ORAL_CAPSULE | Freq: Every day | ORAL | Status: DC

## 2013-03-08 NOTE — Telephone Encounter (Signed)
Fax from [facility] on 03/03/2013,  Niaspan ER 500 mg will no longer be provided through this patient assistance program because there is a generic form now. Patient will also receive this notification. Alternatives are medicaid and [employer].

## 2013-03-08 NOTE — Progress Notes (Signed)
Subjective:    Patient ID: Dave Cox, male    DOB: [birth year], 72 y.o.   MRN: [id number]  HPI Patient is here today for complete physical exam. His most recent colonoscopy was performed in 2008 and is good until 2018. He had a pneumonia vaccine in 2012 oh he is due for Prevnar 13. He had tetanus shot in 2009. He is due for his annual flu shot.  He is also due for prostate screening.  We also did recheck his blood sugar. His fasting blood sugars have been elevated 115 in the summertime. His last down to 188 pounds a light to recheck that today. He also complains of orthostatic dizziness with position changes. He is currently taking doxazosin 4 mg by mouth daily for BPH as well as hypertension. Past Medical History  Diagnosis Date  . Cervical osteoarthritis   . CAD (coronary artery disease)   . HTN (hypertension)   . Nephrolithiasis   . Melanoma     eye  . GERD (gastroesophageal reflux disease)   . Hyperlipidemia   . Abnormal EKG    Past Surgical History  Procedure Laterality Date  . Cardiac catheterization    . Melanoma excision      eye  . Tonsillectomy    . Hernia repair    . Kidney stone surgery    . Lumbar disc surgery    . Basal cell carcinoma excision     Current Outpatient Prescriptions on File Prior to Visit  Medication Sig Dispense Refill  . albuterol (VENTOLIN HFA) 108 (90 BASE) MCG/ACT inhaler Inhale 2 puffs into the lungs 4 (four) times daily as needed for wheezing or shortness of breath.  3 Inhaler  3  . Ascorbic Acid (VITAMIN C PO) Take by mouth daily. Taking 1,000mg       . aspirin 81 MG tablet Take 81 mg by mouth daily.        . B Complex-C (SUPER B COMPLEX PO) Take by mouth daily.        . celecoxib (CELEBREX) 200 MG capsule Take 1 capsule (200 mg total) by mouth daily.  100 capsule  1  . Cholecalciferol (VITAMIN D) 1000 UNITS capsule Take 1,000 Units by mouth daily.        Marland Kitchen doxazosin (CARDURA) 4 MG tablet Take 1 tablet (4 mg total) by mouth at bedtime.  100  tablet  3  . esomeprazole (NEXIUM) 40 MG capsule Take 40 mg by mouth daily.        . fish oil-omega-3 fatty acids 1000 MG capsule Take 1 g by mouth daily.        . Flaxseed, Linseed, (FLAX SEED OIL PO) Take by mouth daily.        Marland Kitchen LORATADINE PO Take 10 mg by mouth as needed.       . mometasone (NASONEX) 50 MCG/ACT nasal spray 1-2 puffs each nostril once or twice daily  17 g  prn  . Multiple Vitamin (MULTIVITAMIN) tablet Take 1 tablet by mouth daily.        . niacin (NIASPAN) 1000 MG CR tablet Take 1,000 mg by mouth at bedtime.        . pyridOXINE (VITAMIN B-6) 100 MG tablet Take 100 mg by mouth daily.      . rosuvastatin (CRESTOR) 10 MG tablet Take 10 mg by mouth daily.      . vitamin E (VITAMIN E) 400 UNIT capsule Take 1 capsule (400 Units total) by mouth daily.  30 capsule  No current facility-administered medications on file prior to visit.   No Known Allergies History   Social History  . Marital Status: Married    Spouse Name: N/A    Number of Children: N/A  . Years of Education: N/A   Occupational History  . Not on file.   Social History Main Topics  . Smoking status: Never Smoker   . Smokeless tobacco: Not on file  . Alcohol Use:   . Drug Use:   . Sexual Activity:    Other Topics Concern  . Not on file   Social History Narrative  . No narrative on file   Family History  Problem Relation Age of Onset  . Prostate cancer Father     mets  . Arthritis Father   . Hypertension Father   . Arthritis Mother   . Heart disease Mother   . Hypertension Mother   . Stroke Mother   . Lung cancer Brother     was a smoker      Review of Systems  All other systems reviewed and are negative.       Objective:   Physical Exam  Vitals reviewed. Constitutional: He is oriented to person, place, and time. He appears well-developed and well-nourished. No distress.  HENT:  Head: Normocephalic and atraumatic.  Right Ear: External ear normal.  Left Ear: External ear  normal.  Nose: Nose normal.  Mouth/Throat: Oropharynx is clear and moist. No oropharyngeal exudate.  Eyes: Conjunctivae and EOM are normal. Pupils are equal, round, and reactive to light. Right eye exhibits no discharge. Left eye exhibits no discharge. No scleral icterus.  Neck: Normal range of motion. Neck supple. No JVD present. No tracheal deviation present. No thyromegaly present.  Cardiovascular: Normal rate, regular rhythm, normal heart sounds and intact distal pulses.  Exam reveals no gallop and no friction rub.   No murmur heard. Pulmonary/Chest: Effort normal and breath sounds normal. No stridor. No respiratory distress. He has no wheezes. He has no rales. He exhibits no tenderness.  Abdominal: Soft. Bowel sounds are normal. He exhibits no distension and no mass. There is no tenderness. There is no rebound and no guarding.  Genitourinary: Rectum normal and penis normal.  Musculoskeletal: Normal range of motion. He exhibits no edema and no tenderness.  Lymphadenopathy:    He has no cervical adenopathy.  Neurological: He is alert and oriented to person, place, and time. He has normal reflexes. He displays normal reflexes. No cranial nerve deficit. He exhibits normal muscle tone. Coordination normal.  Skin: Skin is warm. No rash noted. He is not diaphoretic. No erythema. No pallor.  Psychiatric: He has a normal mood and affect. His behavior is normal. Judgment and thought content normal.  Prostate is 2+ in size but without nodularity        Assessment & Plan:  1. Routine general medical examination at a health care facility Cancer screening is up to date as of today. I will check a PSA. The patient will get the flu shot today. We discussed Prevnar 13 but he elects to defer for now until he checks to see if his insurance will pay for it. I recommended discontinuing doxazosin. I recommend replacing it with Avodart 0.5 mg by mouth daily. This is done to treat his BPH and also prevent some  orthostatic dizziness he's been getting which I think may be attributable to the doxazosin. If his blood pressure rises we can increase the Avapro to 300 mg by mouth daily.  Check blood pressure daily for the next 2-3 weeks and report to me in values. Also check a fasting lipid panel and his goal LDL is less than 70. - CBC with Differential - COMPLETE METABOLIC PANEL WITH GFR - Lipid panel - PSA, Medicare  2. Need for prophylactic vaccination and inoculation against influenza  - Flu Vaccine QUAD 36+ mos IM

## 2013-03-09 LAB — PSA, MEDICARE: PSA: 1.48 ng/mL (ref <=4.00)

## 2013-03-11 LAB — HEMOGLOBIN A1C
Hgb A1c MFr Bld: 6 % — ABNORMAL HIGH (ref <5.7)
Mean Plasma Glucose: 126 mg/dL — ABNORMAL HIGH (ref <117)

## 2013-03-12 ENCOUNTER — Encounter: Payer: Self-pay | Admitting: Family Medicine

## 2013-03-12 ENCOUNTER — Other Ambulatory Visit: Payer: Self-pay | Admitting: Family Medicine

## 2013-03-12 MED ORDER — ROSUVASTATIN CALCIUM 10 MG PO TABS: 10.0000 mg | ORAL_TABLET | Freq: Every day | ORAL | Status: DC

## 2013-03-12 MED ORDER — ESOMEPRAZOLE MAGNESIUM 40 MG PO CPDR: 40.0000 mg | DELAYED_RELEASE_CAPSULE | Freq: Every day | ORAL | Status: DC

## 2013-03-12 NOTE — Telephone Encounter (Signed)
Pt assistance forms completed and RX printed for return to patient.

## 2013-03-29 ENCOUNTER — Other Ambulatory Visit: Payer: Self-pay | Admitting: *Deleted

## 2013-03-29 ENCOUNTER — Telehealth: Payer: Self-pay | Admitting: Family Medicine

## 2013-03-29 NOTE — Telephone Encounter (Signed)
Pt was seen other day for blood work and his blood work was not coded right because he had received a bill from the lab Call back number [phone]

## 2013-03-29 NOTE — Telephone Encounter (Signed)
Received 2 niaspan bottles for patient via [facility].

## 2013-03-29 NOTE — Telephone Encounter (Signed)
Fix coding???

## 2013-03-29 NOTE — Telephone Encounter (Signed)
Called patient to let him know his niaspan was shipped here for him to pick up.

## 2013-04-08 DIAGNOSIS — M19049 Primary osteoarthritis, unspecified hand: Secondary | ICD-10-CM | POA: Diagnosis not present

## 2013-05-13 DIAGNOSIS — M19049 Primary osteoarthritis, unspecified hand: Secondary | ICD-10-CM | POA: Diagnosis not present

## 2013-05-17 ENCOUNTER — Telehealth: Payer: Self-pay | Admitting: Family Medicine

## 2013-05-17 NOTE — Telephone Encounter (Signed)
Pt is needing Celebrex refilled Pharmacy [phone] ID # [phone] Venancio Poisson)  Call back number [phone]

## 2013-05-19 MED ORDER — CELECOXIB 200 MG PO CAPS: 200.0000 mg | ORAL_CAPSULE | Freq: Every day | ORAL | Status: DC

## 2013-05-19 NOTE — Telephone Encounter (Signed)
Confirmation # [id number]  Refill called to [facility] for refill of Celebrex under patient assistance program.  Pt aware.

## 2013-06-11 ENCOUNTER — Telehealth: Payer: Self-pay | Admitting: Internal Medicine

## 2013-06-11 NOTE — Telephone Encounter (Signed)
I have completed our part and mailed out to [place] to [employer]; Copies made of forms and sent to scan in EPIC. Pt aware that forms have been mailed out.

## 2013-07-07 DIAGNOSIS — D239 Other benign neoplasm of skin, unspecified: Secondary | ICD-10-CM | POA: Diagnosis not present

## 2013-07-07 DIAGNOSIS — L821 Other seborrheic keratosis: Secondary | ICD-10-CM | POA: Diagnosis not present

## 2013-07-07 DIAGNOSIS — Z85828 Personal history of other malignant neoplasm of skin: Secondary | ICD-10-CM | POA: Diagnosis not present

## 2013-07-15 DIAGNOSIS — M47812 Spondylosis without myelopathy or radiculopathy, cervical region: Secondary | ICD-10-CM | POA: Diagnosis not present

## 2013-07-20 DIAGNOSIS — M19049 Primary osteoarthritis, unspecified hand: Secondary | ICD-10-CM | POA: Diagnosis not present

## 2013-08-26 ENCOUNTER — Telehealth: Payer: Self-pay | Admitting: Internal Medicine

## 2013-08-26 ENCOUNTER — Telehealth: Payer: Self-pay | Admitting: Family Medicine

## 2013-08-26 MED ORDER — ALBUTEROL SULFATE HFA 108 (90 BASE) MCG/ACT IN AERS: 2.0000 | INHALATION_SPRAY | Freq: Four times a day (QID) | RESPIRATORY_TRACT | Status: DC | PRN

## 2013-08-26 NOTE — Telephone Encounter (Signed)
Called and spoke with pt and he requested that we call and get his ventolin refilled by the pt assistance program.  i called and they stated that the pt does not have any other refills so they will need a new rx faxed into them today to get this processed.  rx has been printed and give to CY to sign.  Will fax back ---with a cover sheet once completed.

## 2013-08-26 NOTE — Telephone Encounter (Signed)
rx has been signed by CY and faxed to the number given for the pt assistance program to get his medication filled.  Nothing further is needed

## 2013-08-26 NOTE — Telephone Encounter (Signed)
Call placed to [employer].   Medication refilled.   Order number: [id number].  Will be sent out to office in 7-10 days.

## 2013-08-26 NOTE — Telephone Encounter (Signed)
Patient is calling to say that he needs refill on his celebrex we would need to call [phone] and give them an id number of [phone]

## 2013-09-02 DIAGNOSIS — M25519 Pain in unspecified shoulder: Secondary | ICD-10-CM | POA: Diagnosis not present

## 2013-09-02 DIAGNOSIS — M67919 Unspecified disorder of synovium and tendon, unspecified shoulder: Secondary | ICD-10-CM | POA: Diagnosis not present

## 2013-09-21 ENCOUNTER — Other Ambulatory Visit: Payer: Self-pay | Admitting: Family Medicine

## 2013-09-21 MED ORDER — CELECOXIB 200 MG PO CAPS: 200.0000 mg | ORAL_CAPSULE | Freq: Every day | ORAL | Status: DC

## 2013-10-07 DIAGNOSIS — M25519 Pain in unspecified shoulder: Secondary | ICD-10-CM | POA: Diagnosis not present

## 2013-10-11 DIAGNOSIS — H2510 Age-related nuclear cataract, unspecified eye: Secondary | ICD-10-CM | POA: Diagnosis not present

## 2013-10-11 DIAGNOSIS — H43399 Other vitreous opacities, unspecified eye: Secondary | ICD-10-CM | POA: Diagnosis not present

## 2013-10-14 ENCOUNTER — Ambulatory Visit (INDEPENDENT_AMBULATORY_CARE_PROVIDER_SITE_OTHER): Payer: Medicare Other | Admitting: Cardiovascular Disease

## 2013-10-14 ENCOUNTER — Encounter: Payer: Self-pay | Admitting: Cardiovascular Disease

## 2013-10-14 ENCOUNTER — Other Ambulatory Visit: Payer: Medicare Other

## 2013-10-14 VITALS — BP 140/70 | HR 62 | Ht 75.0 in | Wt 185.0 lb

## 2013-10-14 DIAGNOSIS — E785 Hyperlipidemia, unspecified: Secondary | ICD-10-CM | POA: Diagnosis not present

## 2013-10-14 DIAGNOSIS — I2510 Atherosclerotic heart disease of native coronary artery without angina pectoris: Secondary | ICD-10-CM | POA: Diagnosis not present

## 2013-10-14 LAB — HEPATIC FUNCTION PANEL
ALBUMIN: 4.1 g/dL (ref 3.5–5.2)
ALT: 24 U/L (ref 0–53)
AST: 30 U/L (ref 0–37)
Alkaline Phosphatase: 54 U/L (ref 39–117)
Bilirubin, Direct: 0.1 mg/dL (ref 0.0–0.3)
TOTAL PROTEIN: 6.8 g/dL (ref 6.0–8.3)
Total Bilirubin: 1.3 mg/dL — ABNORMAL HIGH (ref 0.2–1.2)

## 2013-10-14 LAB — BASIC METABOLIC PANEL
BUN: 11 mg/dL (ref 6–23)
CHLORIDE: 103 meq/L (ref 96–112)
CO2: 30 mEq/L (ref 19–32)
CREATININE: 1 mg/dL (ref 0.4–1.5)
Calcium: 9.4 mg/dL (ref 8.4–10.5)
GFR: 78.81 mL/min (ref >60.00)
Glucose, Bld: 101 mg/dL — ABNORMAL HIGH (ref 70–99)
POTASSIUM: 4 meq/L (ref 3.5–5.1)
SODIUM: 139 meq/L (ref 135–145)

## 2013-10-14 LAB — LIPID PANEL
CHOL/HDL RATIO: 4
Cholesterol: 171 mg/dL (ref 0–200)
HDL: 40.6 mg/dL (ref >39.00)
LDL CALC: 107 mg/dL — AB (ref 0–99)
NonHDL: 130.4
TRIGLYCERIDES: 117 mg/dL (ref 0.0–149.0)
VLDL: 23.4 mg/dL (ref 0.0–40.0)

## 2013-10-14 NOTE — Progress Notes (Signed)
[name] Date of Birth  09-Jul-1940       [facility] [address], [name], [place]  [address]   [place], [place]  [address] [phone]     [phone]   Fax  [phone]    Fax [phone]  Problem List: 1. Hypertension 2. Mild coronary artery disease 3. Hyperlipidemia  History of Present Illness:  Dave Cox is a 73 y.o. gentleman with a history of hypertension and mild coronary artery disease. He had a heart catheterization in the recent past which revealed fairly normal coronary arteries. He was also found to have left ventricular hypertrophy which explained his abnormal EKG - T-wave inversions.  He's had an echocardiogram performed in March of 2002 at which revealed normal left ventricular systolic function. He has mild left ventricular hypertrophy. The echo was otherwise fairly normal.  He has had an episode of orthostatic hypotension.  He's anxious about the fact that several family members have had coronary artery bypass grafting at age 95. His lipid levels have been fairly well controlled. We will draw his lipid levels today.  October 27, 2012  Dave Cox is doing well.  He is exercising 3 times a week without difficulty.   His BP at is typically well controlled.  He avoids salt.  He is taking vit B6 and B12.  He has a family hx of carotid disease.  He has had lifeline screening test and has been found to have no significant carotid disease.  We discussed that we did similar screening carotid dopplers.   October 14, 2013:  Dave Cox is doing well.   He has not had any chest pain.  He has been losing weight on purpose.   He typically gets a lower BP when he checks it at home.    Current Outpatient Prescriptions on File Prior to Visit  Medication Sig Dispense Refill  . albuterol (VENTOLIN HFA) 108 (90 BASE) MCG/ACT inhaler Inhale 2 puffs into the lungs 4 (four) times daily as needed for wheezing or shortness of  breath.  3 Inhaler  3  . Ascorbic Acid (VITAMIN C PO) Take by mouth daily. Taking 1,000mg       . aspirin 81 MG tablet Take 81 mg by mouth daily.        . B Complex-C (SUPER B COMPLEX PO) Take by mouth daily.        . celecoxib (CELEBREX) 200 MG capsule Take 1 capsule (200 mg total) by mouth daily.  100 capsule  3  . Cholecalciferol (VITAMIN D) 1000 UNITS capsule Take 1,000 Units by mouth daily.        . Cyanocobalamin (VITAMIN B 12 PO) Take by mouth.      . doxazosin (CARDURA) 4 MG tablet Take 1 tablet (4 mg total) by mouth at bedtime.  100 tablet  3  . dutasteride (AVODART) 0.5 MG capsule Take 1 capsule (0.5 mg total) by mouth daily.  90 capsule  3  . esomeprazole (NEXIUM) 40 MG capsule Take 1 capsule (40 mg total) by mouth daily.  90 capsule  3  . fish oil-omega-3 fatty acids 1000 MG capsule Take 1 g by mouth daily.        . Flaxseed, Linseed, (FLAX SEED OIL PO) Take by mouth daily.        . irbesartan (AVAPRO) 150 MG tablet Take 150 mg by mouth at bedtime.      Marland Kitchen LORATADINE  PO Take 10 mg by mouth as needed.       . mometasone (NASONEX) 50 MCG/ACT nasal spray 1-2 puffs each nostril once or twice daily  17 g  prn  . Multiple Vitamin (MULTIVITAMIN) tablet Take 1 tablet by mouth daily.        . niacin (NIASPAN) 1000 MG CR tablet Take 1,000 mg by mouth at bedtime.        . pyridOXINE (VITAMIN B-6) 100 MG tablet Take 100 mg by mouth daily.      . rosuvastatin (CRESTOR) 10 MG tablet Take 1 tablet (10 mg total) by mouth daily.  90 tablet  3  . vitamin E (VITAMIN E) 400 UNIT capsule Take 1 capsule (400 Units total) by mouth daily.  30 capsule     No current facility-administered medications on file prior to visit.    No Known Allergies  Past Medical History  Diagnosis Date  . Cervical osteoarthritis   . CAD (coronary artery disease)   . HTN (hypertension)   . Nephrolithiasis   . Melanoma     eye  . GERD (gastroesophageal reflux disease)   . Hyperlipidemia   . Abnormal EKG     Past  Surgical History  Procedure Laterality Date  . Cardiac catheterization    . Melanoma excision      eye  . Tonsillectomy    . Hernia repair    . Kidney stone surgery    . Lumbar disc surgery    . Basal cell carcinoma excision      History  Smoking status  . Never Smoker   Smokeless tobacco  . Not on file    History  Alcohol Use     Family History  Problem Relation Age of Onset  . Prostate cancer Father     mets  . Arthritis Father   . Hypertension Father   . Arthritis Mother   . Heart disease Mother   . Hypertension Mother   . Stroke Mother   . Lung cancer Brother     was a smoker    Reviw of Systems:  Reviewed in the HPI.  All other systems are negative.  Physical Exam: Blood pressure 140/70, pulse 62, height 6\' 3"  (1.905 m), weight 185 lb (83.915 kg). General: Well developed, well nourished, in no acute distress.  Head: Normocephalic, atraumatic, sclera non-icteric, mucus membranes are moist,   Neck: Supple. Carotids are 2 + without bruits. No JVD  Lungs: Clear bilaterally to auscultation.  Heart: regular rate.  normal  S1 S2. No murmurs, gallops or rubs.  Abdomen: Soft, non-tender, non-distended with normal bowel sounds. No hepatomegaly. No rebound/guarding. No masses.  Msk:  Strength and tone are normal  Extremities: No clubbing or cyanosis. No edema.  Distal pedal pulses are 2+ and equal bilaterally.  Neuro: Alert and oriented X 3. Moves all extremities spontaneously.  Psych:  Responds to questions appropriately with a normal affect.  ECG: October 14, 2013:  NSR at 12.  Moderate voltage criteria for LVH with associated TWI laterally   Assessment / Plan:

## 2013-10-14 NOTE — Assessment & Plan Note (Signed)
His lipids have been well controlled.  Will check labs today and will have him follow up with his medical doctor for further management.

## 2013-10-14 NOTE — Assessment & Plan Note (Signed)
He has minimal CAD.  Has had 2 caths, the second one looked better than the first. Will decrease his ASA to 81 mg a day DC the vit E.  Labs today. Will have him follow up with his primary medical doctor.

## 2013-10-14 NOTE — Patient Instructions (Addendum)
Your physician has recommended you make the following change in your medication:  1) STOP VITAMIN E 2) STOP ASPIRIN 325MG   Lab Today: Lipid, Lft, Bmet. We will forward a copy of your labs to Dr.Pickard  Take all other medications as needed  Your physician recommends that you schedule a follow-up appointment as needed

## 2013-10-18 ENCOUNTER — Telehealth: Payer: Self-pay | Admitting: Nurse Practitioner

## 2013-10-18 DIAGNOSIS — R209 Unspecified disturbances of skin sensation: Secondary | ICD-10-CM | POA: Diagnosis not present

## 2013-10-18 DIAGNOSIS — M47812 Spondylosis without myelopathy or radiculopathy, cervical region: Secondary | ICD-10-CM | POA: Diagnosis not present

## 2013-10-18 MED ORDER — ROSUVASTATIN CALCIUM 20 MG PO TABS: 20.0000 mg | ORAL_TABLET | Freq: Every day | ORAL | Status: DC

## 2013-10-18 NOTE — Telephone Encounter (Signed)
Reviewed lab results and plan of care with patient who verbalized understanding and agreement.  Rx sent to patient's pharmacy and patient aware to f/u with either Dr. Acie Fredrickson or Dr. Dennard Schaumann for repeat lab work in 3 months.

## 2013-10-18 NOTE — Telephone Encounter (Signed)
Message copied by Emmaline Life on Mon Oct 18, 2013  2:22 PM ------      Message from: Joelyn Oms F      Created: Mon Oct 18, 2013 12:36 PM                   ----- Message -----         From: Thayer Headings, MD         Sent: 10/14/2013   5:24 PM           To: Susy Frizzle, MD, [facility] Triage            LDL is still a bit high ( 107) his goal is 70.      Please have him increase crestor to 20 a day      I think he is going to follow up with Dr Dennard Schaumann ( primary md)            He will need follow up lipid, liver, bmp in 3 months - either at Dr. Antony Contras office or here. ------

## 2013-10-20 ENCOUNTER — Other Ambulatory Visit: Payer: Self-pay | Admitting: Internal Medicine

## 2013-10-20 NOTE — Addendum Note (Signed)
Addended by: Parke Poisson E on: 10/20/2013 11:29 AM   Modules accepted: Orders

## 2013-10-20 NOTE — Telephone Encounter (Signed)
Received fax from [employer] Patient Assistance stating:  Pharmacy is in the process of fulfilling your request for a patient of your for the [employer] Patient Assistance Program.  We are in need fo the followinf information to complete this process.  Please provide quantity, program will allow 90 days with 3 refills.  CDY wrote on form for #3 with 3 additional refills.  Faxed back to [phone].  Will sign off and send form to be scanned.

## 2013-12-21 DIAGNOSIS — M47812 Spondylosis without myelopathy or radiculopathy, cervical region: Secondary | ICD-10-CM | POA: Diagnosis not present

## 2013-12-23 ENCOUNTER — Telehealth: Payer: Self-pay | Admitting: Family Medicine

## 2013-12-23 NOTE — Telephone Encounter (Signed)
Patient is calling for refills on avadart and celebrex if possible  He left a message with mentioning phizer, I could not understand him.  Did not leave me a phone number, so the phone number I am putting in the one we have  [phone]

## 2013-12-24 NOTE — Telephone Encounter (Signed)
Pt is needing refill on his medications through patient assistance programs.  Celebrex 200mg  [employer] program - Member [phone] Phone [phone]  Avodart .5mg  cap Tax adviser # [phone] Phone [phone]

## 2013-12-27 DIAGNOSIS — M542 Cervicalgia: Secondary | ICD-10-CM | POA: Diagnosis not present

## 2013-12-27 NOTE — Telephone Encounter (Signed)
Tried to order Avodart from [facility] to access and they stated that the pt has already received a qty of 90 that was delivered on 12/22/13. This was confirmed by fedex tracking.

## 2013-12-27 NOTE — Telephone Encounter (Signed)
Ordered Celebrex - Order #[id number] Med order will be processed on 01/03/14 and delivered after that date.

## 2014-01-06 ENCOUNTER — Ambulatory Visit: Payer: Medicare Other | Admitting: Internal Medicine

## 2014-01-06 DIAGNOSIS — M542 Cervicalgia: Secondary | ICD-10-CM | POA: Diagnosis not present

## 2014-01-14 DIAGNOSIS — M542 Cervicalgia: Secondary | ICD-10-CM | POA: Diagnosis not present

## 2014-01-14 DIAGNOSIS — M47812 Spondylosis without myelopathy or radiculopathy, cervical region: Secondary | ICD-10-CM | POA: Diagnosis not present

## 2014-01-14 DIAGNOSIS — M5412 Radiculopathy, cervical region: Secondary | ICD-10-CM | POA: Diagnosis not present

## 2014-02-03 DIAGNOSIS — M47812 Spondylosis without myelopathy or radiculopathy, cervical region: Secondary | ICD-10-CM | POA: Diagnosis not present

## 2014-02-03 DIAGNOSIS — M542 Cervicalgia: Secondary | ICD-10-CM | POA: Diagnosis not present

## 2014-02-03 DIAGNOSIS — M5412 Radiculopathy, cervical region: Secondary | ICD-10-CM | POA: Diagnosis not present

## 2014-02-16 ENCOUNTER — Telehealth: Payer: Self-pay | Admitting: Family Medicine

## 2014-02-16 MED ORDER — DUTASTERIDE 0.5 MG PO CAPS: 0.5000 mg | ORAL_CAPSULE | Freq: Every day | ORAL | Status: DC

## 2014-02-16 NOTE — Telephone Encounter (Signed)
Patient said that the company for his Volney Presser is saying that they need a new rx, the fax number is [phone]  The id number for the rx is [id number]

## 2014-02-16 NOTE — Telephone Encounter (Signed)
RX printed and faxed to number given by pt

## 2014-02-23 DIAGNOSIS — Z23 Encounter for immunization: Secondary | ICD-10-CM | POA: Diagnosis not present

## 2014-03-02 IMAGING — CR DG CHEST 2V
3 series · 3 of 3 positions shown · non-contrast
Comparison: 11/27/2011

CLINICAL DATA: Asthma and coronary artery disease.

CHEST - 2 VIEW

[view not recorded (1 of 3)]
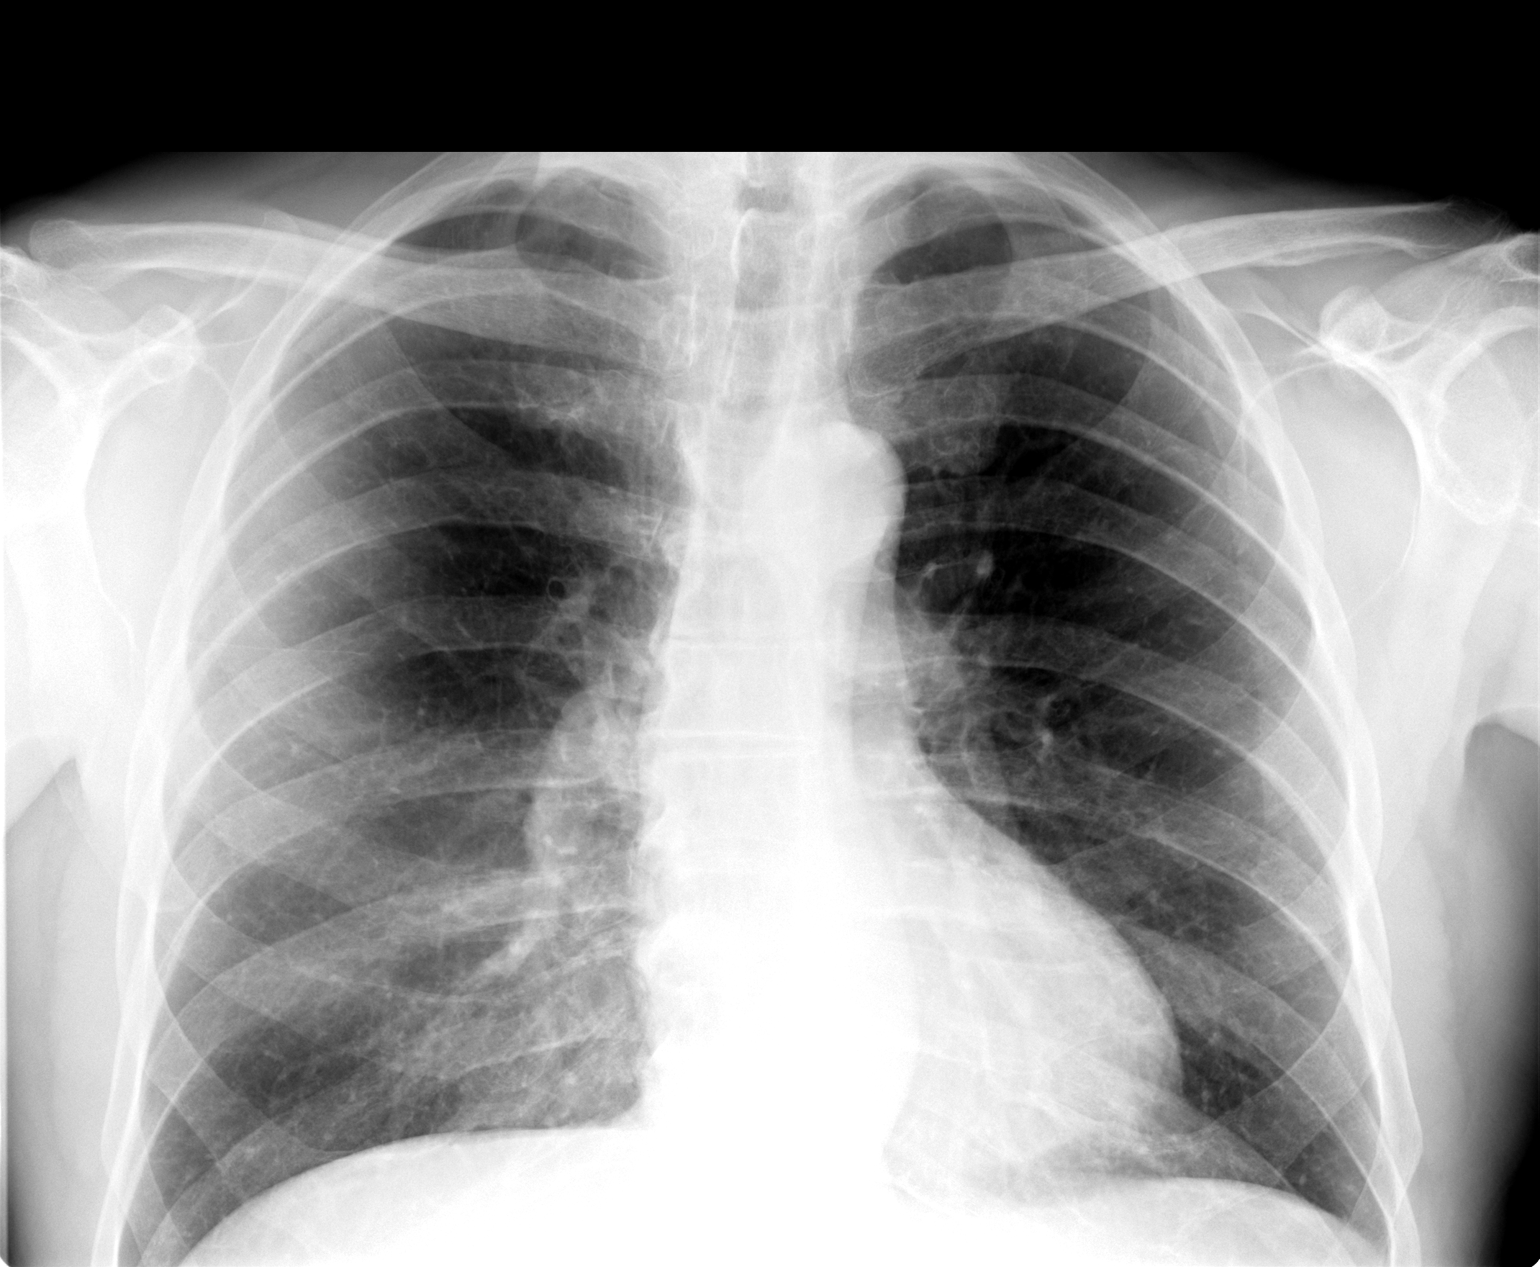

[view not recorded (2 of 3)]
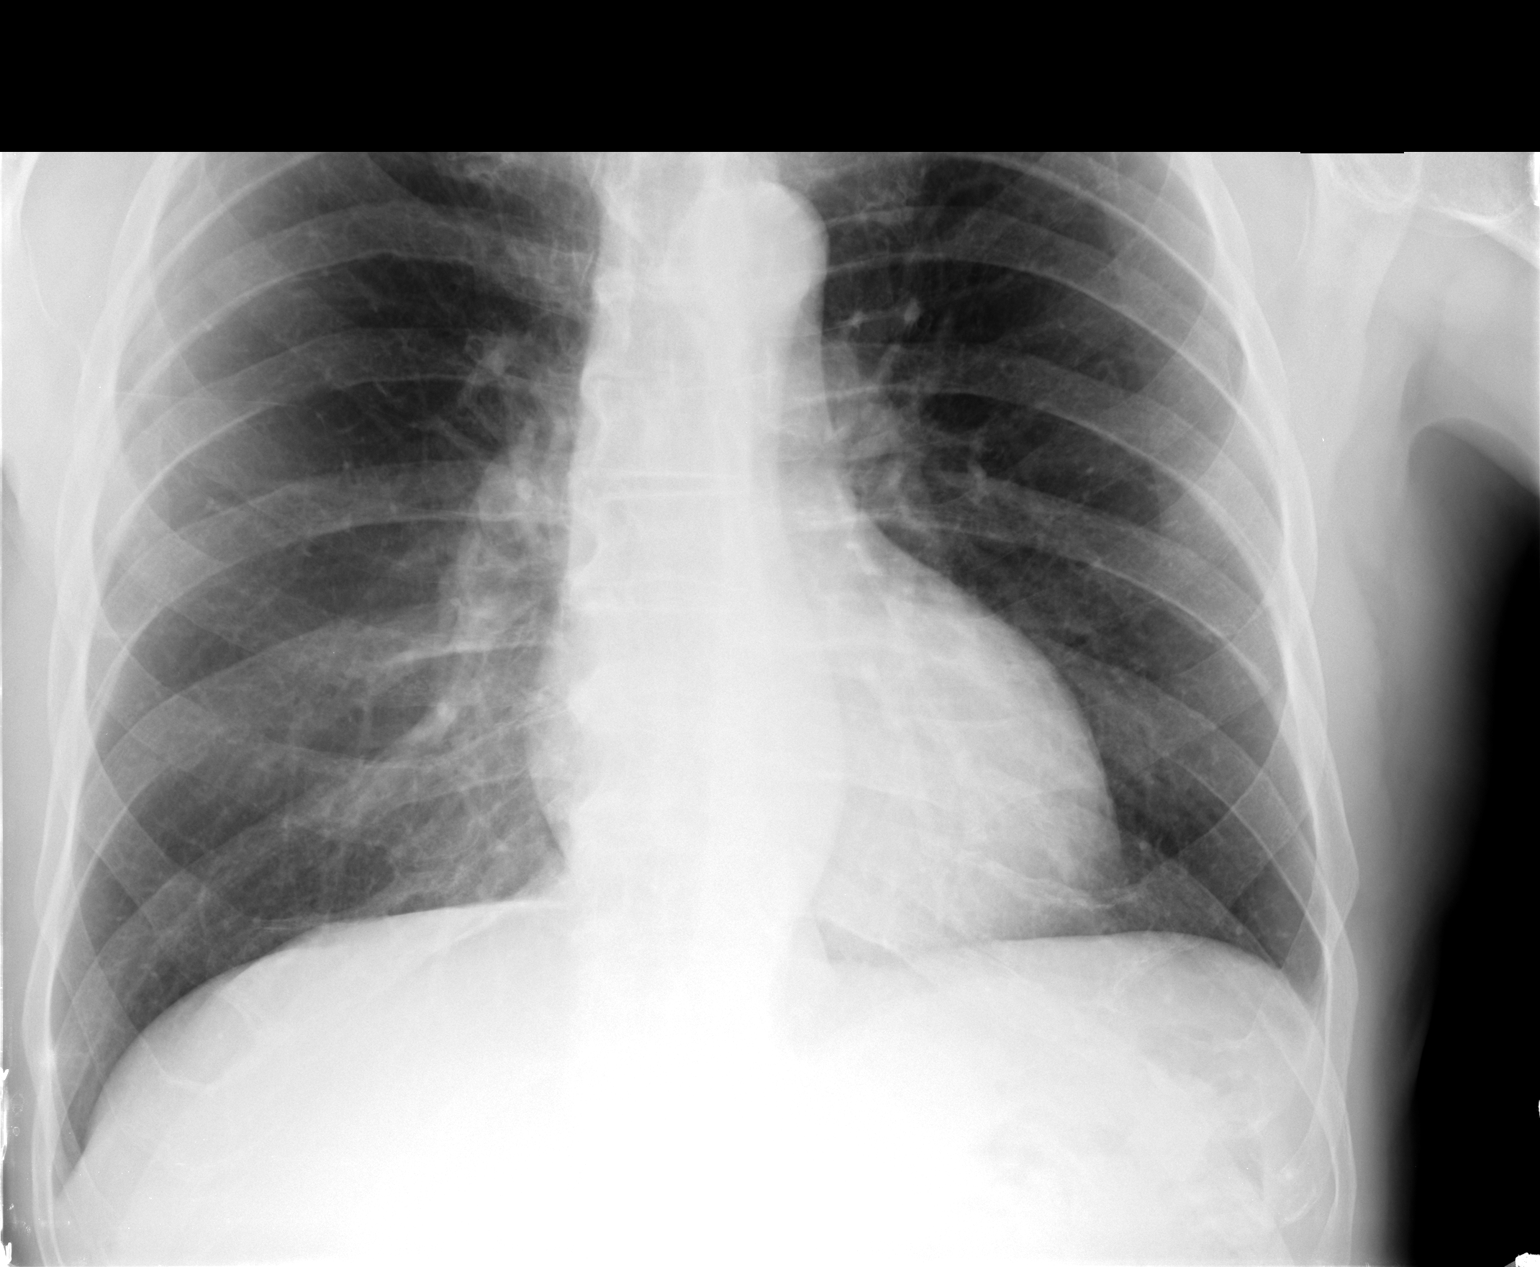

[view not recorded (3 of 3)]
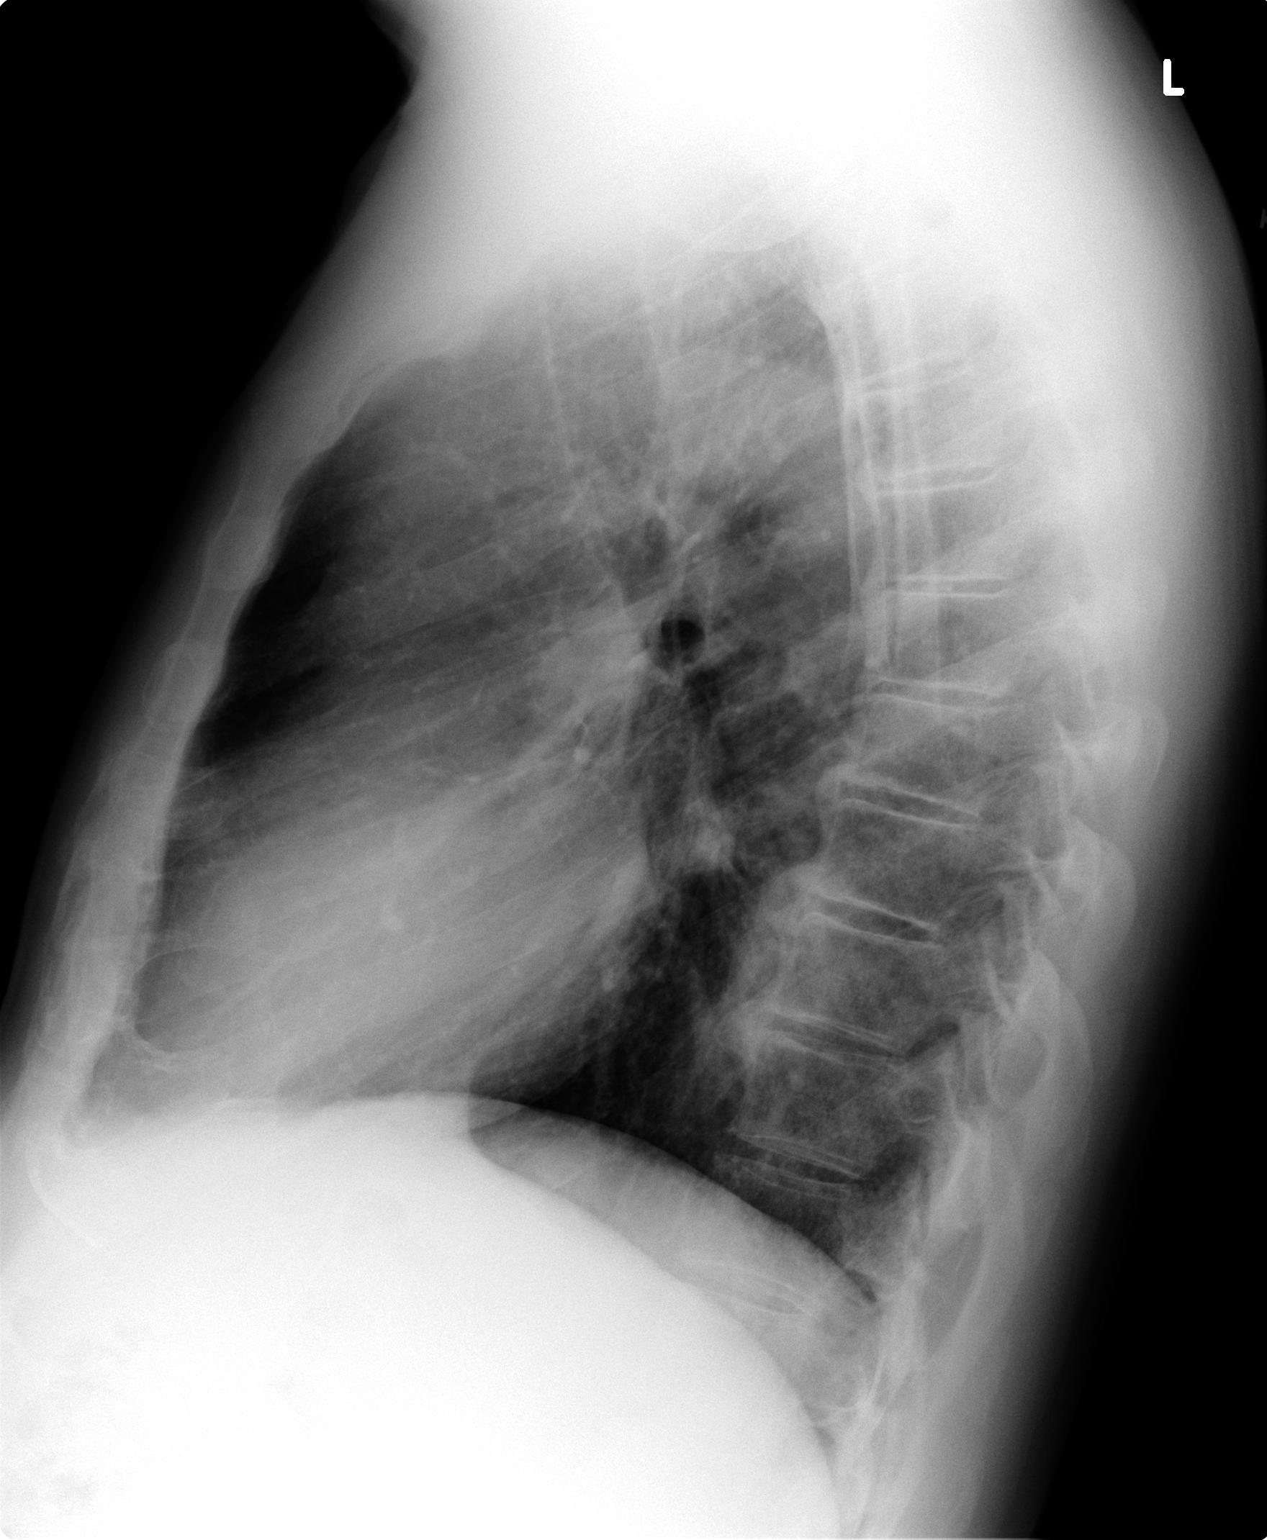

[3 of 3 positions shown; findings below may reference images not displayed]

FINDINGS: Two views of the chest demonstrate clear lungs.
Degenerative changes in the thoracic spine. Heart and mediastinum
are within normal limits.
IMPRESSION: No acute cardiopulmonary disease.  Stable chest radiograph
findings.

## 2014-03-07 DIAGNOSIS — M1812 Unilateral primary osteoarthritis of first carpometacarpal joint, left hand: Secondary | ICD-10-CM | POA: Diagnosis not present

## 2014-03-10 ENCOUNTER — Encounter: Payer: Self-pay | Admitting: Family Medicine

## 2014-03-10 ENCOUNTER — Encounter (INDEPENDENT_AMBULATORY_CARE_PROVIDER_SITE_OTHER): Payer: Self-pay

## 2014-03-10 ENCOUNTER — Ambulatory Visit (INDEPENDENT_AMBULATORY_CARE_PROVIDER_SITE_OTHER): Payer: Medicare Other | Admitting: Family Medicine

## 2014-03-10 VITALS — BP 118/70 | HR 80 | Temp 98.1°F | Resp 16 | Ht 74.5 in | Wt 189.0 lb

## 2014-03-10 DIAGNOSIS — Z23 Encounter for immunization: Secondary | ICD-10-CM | POA: Diagnosis not present

## 2014-03-10 DIAGNOSIS — Z0000 Encounter for general adult medical examination without abnormal findings: Secondary | ICD-10-CM | POA: Diagnosis not present

## 2014-03-10 DIAGNOSIS — Z125 Encounter for screening for malignant neoplasm of prostate: Secondary | ICD-10-CM | POA: Diagnosis not present

## 2014-03-10 DIAGNOSIS — E785 Hyperlipidemia, unspecified: Secondary | ICD-10-CM | POA: Diagnosis not present

## 2014-03-10 DIAGNOSIS — I10 Essential (primary) hypertension: Secondary | ICD-10-CM | POA: Diagnosis not present

## 2014-03-10 LAB — COMPLETE METABOLIC PANEL WITH GFR
ALBUMIN: 4.3 g/dL (ref 3.5–5.2)
ALK PHOS: 69 U/L (ref 39–117)
ALT: 19 U/L (ref 0–53)
AST: 26 U/L (ref 0–37)
BUN: 18 mg/dL (ref 6–23)
CO2: 26 meq/L (ref 19–32)
Calcium: 9.7 mg/dL (ref 8.4–10.5)
Chloride: 103 mEq/L (ref 96–112)
Creat: 0.92 mg/dL (ref 0.50–1.35)
GFR, EST NON AFRICAN AMERICAN: 82 mL/min
GFR, Est African American: >89 mL/min
GLUCOSE: 94 mg/dL (ref 70–99)
POTASSIUM: 4 meq/L (ref 3.5–5.3)
SODIUM: 138 meq/L (ref 135–145)
TOTAL PROTEIN: 6.7 g/dL (ref 6.0–8.3)
Total Bilirubin: 1.6 mg/dL — ABNORMAL HIGH (ref 0.2–1.2)

## 2014-03-10 LAB — CBC WITH DIFFERENTIAL/PLATELET
BASOS ABS: 0 10*3/uL (ref 0.0–0.1)
BASOS PCT: 0 % (ref 0–1)
EOS ABS: 0.3 10*3/uL (ref 0.0–0.7)
EOS PCT: 6 % — AB (ref 0–5)
HEMATOCRIT: 44.8 % (ref 39.0–52.0)
Hemoglobin: 15.5 g/dL (ref 13.0–17.0)
Lymphocytes Relative: 35 % (ref 12–46)
Lymphs Abs: 1.8 10*3/uL (ref 0.7–4.0)
MCH: 31.1 pg (ref 26.0–34.0)
MCHC: 34.6 g/dL (ref 30.0–36.0)
MCV: 90 fL (ref 78.0–100.0)
MONO ABS: 0.6 10*3/uL (ref 0.1–1.0)
Monocytes Relative: 12 % (ref 3–12)
NEUTROS ABS: 2.4 10*3/uL (ref 1.7–7.7)
Neutrophils Relative %: 47 % (ref 43–77)
Platelets: 135 10*3/uL — ABNORMAL LOW (ref 150–400)
RBC: 4.98 MIL/uL (ref 4.22–5.81)
RDW: 13 % (ref 11.5–15.5)
WBC: 5.2 10*3/uL (ref 4.0–10.5)

## 2014-03-10 LAB — LIPID PANEL
Cholesterol: 138 mg/dL (ref 0–200)
HDL: 46 mg/dL (ref >39)
LDL CALC: 71 mg/dL (ref 0–99)
TRIGLYCERIDES: 107 mg/dL (ref <150)
Total CHOL/HDL Ratio: 3 Ratio
VLDL: 21 mg/dL (ref 0–40)

## 2014-03-10 NOTE — Progress Notes (Signed)
Subjective:    Patient ID: Dave Cox, male    DOB: [birth year], 73 y.o.   MRN: [id number]  HPI  Patient is here today for complete physical exam. He denies any major concerns. His last colonoscopy was 2 years ago and was normal. He has a significant family history of colon polyps and colon cancer. He currently is going every 5 years for colonoscopies. He will be due again in 2018.  He is due for prostate exam today. He is due for Prevnar 13. His flu shot is up-to-date. He is also due for fasting lab work. Past Medical History  Diagnosis Date  . Cervical osteoarthritis   . CAD (coronary artery disease)   . HTN (hypertension)   . Nephrolithiasis   . Melanoma     eye  . GERD (gastroesophageal reflux disease)   . Hyperlipidemia   . Abnormal EKG    Past Surgical History  Procedure Laterality Date  . Cardiac catheterization    . Melanoma excision      eye  . Tonsillectomy    . Hernia repair    . Kidney stone surgery    . Lumbar disc surgery    . Basal cell carcinoma excision     Current Outpatient Prescriptions on File Prior to Visit  Medication Sig Dispense Refill  . albuterol (VENTOLIN HFA) 108 (90 BASE) MCG/ACT inhaler Inhale 2 puffs into the lungs 4 (four) times daily as needed for wheezing or shortness of breath. 3 Inhaler 3  . Ascorbic Acid (VITAMIN C PO) Take by mouth daily. Taking 1,000mg     . B Complex-C (SUPER B COMPLEX PO) Take by mouth daily.      . celecoxib (CELEBREX) 200 MG capsule Take 1 capsule (200 mg total) by mouth daily. 100 capsule 3  . Cholecalciferol (VITAMIN D) 1000 UNITS capsule Take 1,000 Units by mouth daily.      Marland Kitchen dutasteride (AVODART) 0.5 MG capsule Take 1 capsule (0.5 mg total) by mouth daily. 90 capsule 3  . esomeprazole (NEXIUM) 40 MG capsule Take 1 capsule (40 mg total) by mouth daily. 90 capsule 3  . fish oil-omega-3 fatty acids 1000 MG capsule Take 1 g by mouth daily.      . Flaxseed, Linseed, (FLAX SEED OIL PO) Take by mouth daily.      .  irbesartan (AVAPRO) 150 MG tablet Take 150 mg by mouth at bedtime.    Marland Kitchen LORATADINE PO Take 10 mg by mouth as needed.     . mometasone (NASONEX) 50 MCG/ACT nasal spray 1-2 puffs each nostril once or twice daily 51 g 3  . Multiple Vitamin (MULTIVITAMIN) tablet Take 1 tablet by mouth daily.      . niacin (NIASPAN) 1000 MG CR tablet Take 1,000 mg by mouth at bedtime.      . pyridOXINE (VITAMIN B-6) 100 MG tablet Take 100 mg by mouth daily.    . rosuvastatin (CRESTOR) 20 MG tablet Take 1 tablet (20 mg total) by mouth daily. 90 tablet 3   No current facility-administered medications on file prior to visit.   No Known Allergies History   Social History  . Marital Status: Married    Spouse Name: N/A    Number of Children: N/A  . Years of Education: N/A   Occupational History  . Not on file.   Social History Main Topics  . Smoking status: Never Smoker   . Smokeless tobacco: Not on file  . Alcohol Use: Not on file  .  Drug Use: Not on file  . Sexual Activity: Not on file   Other Topics Concern  . Not on file   Social History Narrative   Family History  Problem Relation Age of Onset  . Prostate cancer Father     mets  . Arthritis Father   . Hypertension Father   . Arthritis Mother   . Heart disease Mother   . Hypertension Mother   . Stroke Mother   . Lung cancer Brother     was a smoker     Review of Systems  All other systems reviewed and are negative.      Objective:   Physical Exam  Constitutional: He is oriented to person, place, and time. He appears well-developed and well-nourished. No distress.  HENT:  Head: Normocephalic and atraumatic.  Right Ear: External ear normal.  Left Ear: External ear normal.  Nose: Nose normal.  Mouth/Throat: Oropharynx is clear and moist. No oropharyngeal exudate.  Eyes: Conjunctivae and EOM are normal. Pupils are equal, round, and reactive to light. Right eye exhibits no discharge. Left eye exhibits no discharge. No scleral  icterus.  Neck: Normal range of motion. Neck supple. No JVD present. No tracheal deviation present. No thyromegaly present.  Cardiovascular: Normal rate, regular rhythm, normal heart sounds and intact distal pulses.  Exam reveals no gallop and no friction rub.   No murmur heard. Pulmonary/Chest: Effort normal and breath sounds normal. No stridor. No respiratory distress. He has no wheezes. He has no rales. He exhibits no tenderness.  Abdominal: Soft. Bowel sounds are normal. He exhibits no distension and no mass. There is no tenderness. There is no rebound and no guarding.  Genitourinary: Rectum normal, prostate normal and penis normal.  Musculoskeletal: Normal range of motion. He exhibits no edema or tenderness.  Lymphadenopathy:    He has no cervical adenopathy.  Neurological: He is alert and oriented to person, place, and time. He has normal reflexes. He displays normal reflexes. No cranial nerve deficit. He exhibits normal muscle tone. Coordination normal.  Skin: Skin is warm. No rash noted. He is not diaphoretic. No erythema. No pallor.  Psychiatric: He has a normal mood and affect. His behavior is normal. Judgment and thought content normal.  Vitals reviewed.         Assessment & Plan:  Routine general medical examination at a health care facility - Plan: COMPLETE METABOLIC PANEL WITH GFR, CBC with Differential, Lipid panel, PSA, Medicare, Pneumococcal conjugate vaccine 13-valent IM  Need for prophylactic vaccination against Streptococcus pneumoniae (pneumococcus) - Plan: Pneumococcal conjugate vaccine 13-valent IM  Patient's physical exam is completely normal. I will check a CBC, CMP, fasting lipid panel, and PSA. Patient's cancer screening is up-to-date. He received Prevnar 13 today in the office. Regular anticipatory guidance is provided. His blood pressure is excellent. Goal LDL cholesterol is less than 70. I would expect his PSA to decline from last year as we have started avodart  interim. Patient's symptoms of BPH are much better. His prostate does feel smaller today on examination without nodularity.

## 2014-03-11 ENCOUNTER — Encounter: Payer: Self-pay | Admitting: Family Medicine

## 2014-03-11 LAB — PSA, MEDICARE: PSA: 0.96 ng/mL (ref <=4.00)

## 2014-03-16 DIAGNOSIS — M1812 Unilateral primary osteoarthritis of first carpometacarpal joint, left hand: Secondary | ICD-10-CM | POA: Diagnosis not present

## 2014-03-16 DIAGNOSIS — M129 Arthropathy, unspecified: Secondary | ICD-10-CM | POA: Diagnosis not present

## 2014-03-16 DIAGNOSIS — G8918 Other acute postprocedural pain: Secondary | ICD-10-CM | POA: Diagnosis not present

## 2014-03-16 DIAGNOSIS — M654 Radial styloid tenosynovitis [de Quervain]: Secondary | ICD-10-CM | POA: Diagnosis not present

## 2014-03-21 DIAGNOSIS — M1812 Unilateral primary osteoarthritis of first carpometacarpal joint, left hand: Secondary | ICD-10-CM | POA: Diagnosis not present

## 2014-04-05 DIAGNOSIS — M1812 Unilateral primary osteoarthritis of first carpometacarpal joint, left hand: Secondary | ICD-10-CM | POA: Diagnosis not present

## 2014-04-05 DIAGNOSIS — M654 Radial styloid tenosynovitis [de Quervain]: Secondary | ICD-10-CM | POA: Diagnosis not present

## 2014-04-08 ENCOUNTER — Telehealth: Payer: Self-pay | Admitting: Family Medicine

## 2014-04-08 NOTE — Telephone Encounter (Signed)
Patient is calling about the assistance that he gets from [employer] and getting a new rx for his crestor and nexium  Please call him back at  [phone]

## 2014-04-18 ENCOUNTER — Other Ambulatory Visit: Payer: Self-pay | Admitting: Family Medicine

## 2014-04-18 MED ORDER — NEXIUM 40 MG PO PACK: 40.0000 mg | PACK | Freq: Every day | ORAL | Status: DC

## 2014-04-18 NOTE — Telephone Encounter (Signed)
Get Nexium free of charge from [employer].  Forms completed along with RX from provider and mailed back to patient.  Needed to change from capsules to packet form under patient assistance program.  Change made

## 2014-04-26 DIAGNOSIS — M6281 Muscle weakness (generalized): Secondary | ICD-10-CM | POA: Diagnosis not present

## 2014-04-26 DIAGNOSIS — M79645 Pain in left finger(s): Secondary | ICD-10-CM | POA: Diagnosis not present

## 2014-04-26 DIAGNOSIS — M25642 Stiffness of left hand, not elsewhere classified: Secondary | ICD-10-CM | POA: Diagnosis not present

## 2014-04-26 DIAGNOSIS — M1812 Unilateral primary osteoarthritis of first carpometacarpal joint, left hand: Secondary | ICD-10-CM | POA: Diagnosis not present

## 2014-04-26 NOTE — Telephone Encounter (Signed)
These were taken care of on 04/18/14 By Tillman Abide

## 2014-05-20 ENCOUNTER — Telehealth: Payer: Self-pay | Admitting: Family Medicine

## 2014-05-20 NOTE — Telephone Encounter (Signed)
I called and spoke with [name] at [facility] and she has fixed the dx codes to 401.9 and 272.4 instead of the routine general medical. I have also called and spoke with patient and told him to please disregard the letter he received.

## 2014-05-20 NOTE — Telephone Encounter (Signed)
Patient would like you to call him regarding his bill  [phone]

## 2014-05-24 DIAGNOSIS — M25642 Stiffness of left hand, not elsewhere classified: Secondary | ICD-10-CM | POA: Diagnosis not present

## 2014-05-24 DIAGNOSIS — M79645 Pain in left finger(s): Secondary | ICD-10-CM | POA: Diagnosis not present

## 2014-05-24 DIAGNOSIS — M6281 Muscle weakness (generalized): Secondary | ICD-10-CM | POA: Diagnosis not present

## 2014-05-24 DIAGNOSIS — M1812 Unilateral primary osteoarthritis of first carpometacarpal joint, left hand: Secondary | ICD-10-CM | POA: Diagnosis not present

## 2014-06-15 ENCOUNTER — Telehealth: Payer: Self-pay | Admitting: Family Medicine

## 2014-06-15 NOTE — Telephone Encounter (Signed)
Patient says that [facility] did not receive rx for his crestor the fax number is [phone] Also he says that [facility] did not receive the rx for his avidart  Fax number is [phone]  And the id number is [id number] His best call back number is [phone]

## 2014-06-17 MED ORDER — ROSUVASTATIN CALCIUM 20 MG PO TABS: 20.0000 mg | ORAL_TABLET | Freq: Every day | ORAL | Status: DC

## 2014-06-17 MED ORDER — DUTASTERIDE 0.5 MG PO CAPS: 0.5000 mg | ORAL_CAPSULE | Freq: Every day | ORAL | Status: DC

## 2014-06-17 NOTE — Telephone Encounter (Signed)
RX's for both med's reprinted and faxed to pt assist programs. Pt made aware

## 2014-06-28 ENCOUNTER — Telehealth: Payer: Self-pay | Admitting: Family Medicine

## 2014-06-28 DIAGNOSIS — M1812 Unilateral primary osteoarthritis of first carpometacarpal joint, left hand: Secondary | ICD-10-CM | POA: Diagnosis not present

## 2014-06-28 DIAGNOSIS — M654 Radial styloid tenosynovitis [de Quervain]: Secondary | ICD-10-CM | POA: Diagnosis not present

## 2014-06-28 MED ORDER — CELECOXIB 200 MG PO CAPS: 200.0000 mg | ORAL_CAPSULE | Freq: Every day | ORAL | Status: DC

## 2014-06-28 NOTE — Telephone Encounter (Signed)
Medication refilled per protocol. 

## 2014-06-29 ENCOUNTER — Telehealth: Payer: Self-pay | Admitting: Internal Medicine

## 2014-06-29 MED ORDER — ALBUTEROL SULFATE HFA 108 (90 BASE) MCG/ACT IN AERS
2.0000 | INHALATION_SPRAY | Freq: Four times a day (QID) | RESPIRATORY_TRACT | Status: DC | PRN
Start: 2014-06-29 — End: 2015-08-08

## 2014-06-29 NOTE — Telephone Encounter (Signed)
Forms are filled out, Rx's printed and signed by CY and mailed per patient request [place] [address]    Pt has also been scheduled to see CY on Monday 07-04-14 at 3:00pm.

## 2014-07-04 ENCOUNTER — Ambulatory Visit (INDEPENDENT_AMBULATORY_CARE_PROVIDER_SITE_OTHER): Payer: Medicare Other | Admitting: Internal Medicine

## 2014-07-04 ENCOUNTER — Encounter: Payer: Self-pay | Admitting: Internal Medicine

## 2014-07-04 VITALS — BP 130/80 | HR 66 | Ht 75.0 in | Wt 189.0 lb

## 2014-07-04 DIAGNOSIS — K219 Gastro-esophageal reflux disease without esophagitis: Secondary | ICD-10-CM | POA: Diagnosis not present

## 2014-07-04 DIAGNOSIS — J45998 Other asthma: Secondary | ICD-10-CM | POA: Diagnosis not present

## 2014-07-04 NOTE — Patient Instructions (Addendum)
We can continue with the Ventolin as a rescue inhaler  If you find you are needing help on a daily basis, we have other tools, so please let us know.  Consider trying an H2 class acid blocker like pepcid/ famotadine, or zantac or tagamet, instead of your PPI Nexium. You can discuss this with your primary doctor.  Try elevating the head of your bedframe on a brick under each of the head legs.

## 2014-07-04 NOTE — Progress Notes (Signed)
Subjective:    Patient ID: Dave Cox, male    DOB: 1941/02/26, 74 y.o.   MRN: [id number]  HPI 11/21/10- 69 yoM never smoker, followed for chronic obstructive asthma, allergic rhinitis, complicated by CAD   Dave Cox. Last here November 22, 2009- allergic rhinits, obstructive asthma Dave Cox- uses "Entertainer's Secret" spray for throat. 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 Nasonex sample helped postnasal drip. Continues Nexium, but ENT suggested he try taking it twice daily. Younger brother died of lung cancer- former smoker. CXR 06/12/09- clear lungs.   11/27/11-  69 yoM never smoker, followed for chronic obstructive asthma, allergic rhinitis, complicated by CAD   Dave Cox. Last here November 22, 2009- allergic rhinits, obstructive asthma He uses rescue inhaler before singing. Nexium controls heartburn and Nasonex controls postnasal drip. He is satisfied. Father and brother died of lung cancer/smokers; another brother died of coronary disease.  2012/12/25- 71 yoM never smoker, followed for chronic obstructive asthma, allergic rhinitis, complicated by CAD   Dave Cox.   Wife here FOLLOWS FOR: no SOB, wheezing,congestion. has cough and gets strangled at times. Occasional cough especially if he drinks while sitting in his recliner. We discussed reflux and LPR management. 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 Satisfied with Nasonex-little postnasal drip. Takes many vitamin supplements. He is concerned about family history of lung cancer and we discussed CT versus chest x-ray and this never smoker. CXR 11/27/11 IMPRESSION:  Negative exam.  Original Report Authenticated By: Angelita Ingles, M.D.  2/29/19- 71 yoM never smoker, followed for chronic obstructive asthma, allergic rhinitis, complicated by CAD,  GERD   Dave Cox.    FOLLOWS FOR: Pt reports breathing doing well. Occasional ventolin use is sufficient and has helped before he  sings. Hx GERD and concerned about long term use of PPIs.  CXR 12-25-12-  IMPRESSION: No acute cardiopulmonary disease. Stable chest radiograph findings. Original Report Authenticated By: Markus Daft, M.D.  ROS-see HPI Constitutional:   No-   weight loss, night sweats, fevers, chills, fatigue, lassitude. HEENT:   No-  headaches, difficulty swallowing, tooth/dental problems, sore throat,       No-  sneezing, itching, ear ache, nasal congestion, post nasal drip,  CV:  No-   chest pain, orthopnea, PND, swelling in lower extremities, anasarca, dizziness, palpitations Resp: No-   shortness of breath with exertion or at rest.              Mild  productive morning cough,  + non-productive cough,  No- coughing up of blood.              No-   change in color of mucus.  No- wheezing.   Skin: No-   rash or lesions. GI:  No-   heartburn, indigestion, abdominal pain, nausea, vomiting,  GU:  MS:  No-   joint pain or swelling.  . Neuro-     nothing unusual Psych:  No- change in mood or affect. No depression or anxiety.  No memory loss.  Objective:   Physical Exam General- Alert, Oriented, Affect-appropriate, Distress- none acute  Tall man Skin- rash-none, lesions- none, excoriation- none Lymphadenopathy- none Head- atraumatic            Eyes- Gross vision intact, PERRLA, conjunctivae clear secretions            Ears- Hearing, canals            Nose- Clear, no- Septal  dev, mucus, polyps, erosion, perforation             Throat- Mallampati II , mucosa clear , drainage- none, tonsils- atrophic, voice normal, + throat clearing Neck- flexible , trachea midline, no stridor , thyroid nl, carotid no bruit Chest - symmetrical excursion , unlabored           Heart/CV- RRR , no murmur , no gallop  , no rub, nl s1 s2                           - JVD- none , edema- none, stasis changes- none, varices- none           Lung- clear to P&A, wheeze- none, cough- none , dullness-none, rub- none           Chest wall-   Abd-  Br/ Gen/ Rectal- Not done, not indicated Extrem- cyanosis- none, clubbing, none, atrophy- none, strength- nl Neuro- grossly intact to observatio  Assessment & Plan:

## 2014-07-05 DIAGNOSIS — K219 Gastro-esophageal reflux disease without esophagitis: Secondary | ICD-10-CM | POA: Insufficient documentation

## 2014-07-05 NOTE — Assessment & Plan Note (Signed)
Mild intermittent uncomplicated well-controlled with occasional rescue HFA, Discussed indications for a maintenance controller.

## 2014-07-05 NOTE — Assessment & Plan Note (Signed)
Habitual throat clearing may reflect reflux. He can talk with his PCP about H2 acid blockers- zantac, tagamet, pepcid as alternative to PPIs. [facility]OB

## 2014-07-19 DIAGNOSIS — D225 Melanocytic nevi of trunk: Secondary | ICD-10-CM | POA: Diagnosis not present

## 2014-07-19 DIAGNOSIS — L821 Other seborrheic keratosis: Secondary | ICD-10-CM | POA: Diagnosis not present

## 2014-07-19 DIAGNOSIS — I781 Nevus, non-neoplastic: Secondary | ICD-10-CM | POA: Diagnosis not present

## 2014-07-19 DIAGNOSIS — Z85828 Personal history of other malignant neoplasm of skin: Secondary | ICD-10-CM | POA: Diagnosis not present

## 2014-07-19 DIAGNOSIS — L570 Actinic keratosis: Secondary | ICD-10-CM | POA: Diagnosis not present

## 2014-08-25 ENCOUNTER — Telehealth: Payer: Self-pay | Admitting: Family Medicine

## 2014-08-25 NOTE — Telephone Encounter (Signed)
Avapro no longer available free to him through patient assistance.  Wants Rx for Lisinopril to go to [facility] in [place].  Please advise.

## 2014-08-26 MED ORDER — LISINOPRIL 20 MG PO TABS: 20.0000 mg | ORAL_TABLET | Freq: Every day | ORAL | Status: DC

## 2014-08-26 NOTE — Telephone Encounter (Signed)
rx to pharmacy and pt made aware.

## 2014-08-26 NOTE — Telephone Encounter (Signed)
Lisinopril 20 mg poqday.  DC avapro.

## 2014-10-18 ENCOUNTER — Telehealth: Payer: Self-pay | Admitting: Internal Medicine

## 2014-10-18 NOTE — Telephone Encounter (Signed)
Spoke with pt, states he filled out an application to [employer] for Celebrex in [place] and states they've never received his application.  This application was mailed to him by [employer], pt portion filled out by him and mailed to our office at [employer].    Joellen Jersey do you have this form?

## 2014-10-18 NOTE — Telephone Encounter (Signed)
I spoke with patient-he is aware that the forms were mailed to [employer] but were returned to the office due to lack of address information on the envelope. Pt is aware that I am re-mailing the forms today. Nothing more needed at this time.

## 2014-11-03 ENCOUNTER — Other Ambulatory Visit: Payer: Self-pay | Admitting: *Deleted

## 2014-11-03 MED ORDER — MOMETASONE FUROATE 50 MCG/ACT NA SUSP: NASAL | Status: DC

## 2014-12-28 ENCOUNTER — Telehealth: Payer: Self-pay | Admitting: Family Medicine

## 2014-12-28 NOTE — Telephone Encounter (Signed)
Patient called states he needs a refill on his celebrex 200 mg 1/2  ID #[id number] [phone] [facility]

## 2014-12-29 NOTE — Telephone Encounter (Signed)
Called to reorder and pt's enrollment has been terminated since 11/03/14 - called and informed pt.

## 2015-01-05 ENCOUNTER — Telehealth: Payer: Self-pay | Admitting: Family Medicine

## 2015-01-05 NOTE — Telephone Encounter (Signed)
Patient calling to say that he needs new rx for his nexium to go to patient assistance [facility]  Phone number is [phone]

## 2015-01-06 MED ORDER — NEXIUM 40 MG PO PACK: 40.0000 mg | PACK | Freq: Every day | ORAL | Status: DC

## 2015-01-06 NOTE — Telephone Encounter (Signed)
Rx printed and faxed to number below as it is a fax #. Not a phone #.

## 2015-01-25 DIAGNOSIS — H2513 Age-related nuclear cataract, bilateral: Secondary | ICD-10-CM | POA: Diagnosis not present

## 2015-01-25 DIAGNOSIS — H01009 Unspecified blepharitis unspecified eye, unspecified eyelid: Secondary | ICD-10-CM | POA: Diagnosis not present

## 2015-01-25 DIAGNOSIS — H5010 Unspecified exotropia: Secondary | ICD-10-CM | POA: Diagnosis not present

## 2015-03-13 ENCOUNTER — Ambulatory Visit (INDEPENDENT_AMBULATORY_CARE_PROVIDER_SITE_OTHER): Payer: Medicare Other | Admitting: Family Medicine

## 2015-03-13 ENCOUNTER — Encounter: Payer: Self-pay | Admitting: Family Medicine

## 2015-03-13 ENCOUNTER — Other Ambulatory Visit (INDEPENDENT_AMBULATORY_CARE_PROVIDER_SITE_OTHER): Payer: Medicare Other | Admitting: Family Medicine

## 2015-03-13 VITALS — BP 140/86 | HR 68 | Temp 98.2°F | Resp 18 | Ht 74.5 in | Wt 189.0 lb

## 2015-03-13 DIAGNOSIS — E785 Hyperlipidemia, unspecified: Secondary | ICD-10-CM | POA: Diagnosis not present

## 2015-03-13 DIAGNOSIS — I10 Essential (primary) hypertension: Secondary | ICD-10-CM | POA: Diagnosis not present

## 2015-03-13 DIAGNOSIS — Z23 Encounter for immunization: Secondary | ICD-10-CM

## 2015-03-13 DIAGNOSIS — Z0000 Encounter for general adult medical examination without abnormal findings: Secondary | ICD-10-CM

## 2015-03-13 DIAGNOSIS — I2510 Atherosclerotic heart disease of native coronary artery without angina pectoris: Secondary | ICD-10-CM | POA: Diagnosis not present

## 2015-03-13 DIAGNOSIS — Z79899 Other long term (current) drug therapy: Secondary | ICD-10-CM | POA: Diagnosis not present

## 2015-03-13 LAB — LIPID PANEL
CHOL/HDL RATIO: 3.3 ratio (ref <=5.0)
CHOLESTEROL: 149 mg/dL (ref 125–200)
HDL: 45 mg/dL (ref >=40)
LDL Cholesterol: 89 mg/dL (ref <130)
Triglycerides: 74 mg/dL (ref <150)
VLDL: 15 mg/dL (ref <30)

## 2015-03-13 LAB — COMPLETE METABOLIC PANEL WITH GFR
ALBUMIN: 4.5 g/dL (ref 3.6–5.1)
ALK PHOS: 71 U/L (ref 40–115)
ALT: 20 U/L (ref 9–46)
AST: 28 U/L (ref 10–35)
BUN: 14 mg/dL (ref 7–25)
CALCIUM: 9.6 mg/dL (ref 8.6–10.3)
CO2: 28 mmol/L (ref 20–31)
Chloride: 101 mmol/L (ref 98–110)
Creat: 0.93 mg/dL (ref 0.70–1.18)
GFR, EST NON AFRICAN AMERICAN: 81 mL/min (ref >=60)
GFR, Est African American: >89 mL/min (ref >=60)
Glucose, Bld: 107 mg/dL — ABNORMAL HIGH (ref 70–99)
POTASSIUM: 4.4 mmol/L (ref 3.5–5.3)
Sodium: 141 mmol/L (ref 135–146)
Total Bilirubin: 1.1 mg/dL (ref 0.2–1.2)
Total Protein: 6.8 g/dL (ref 6.1–8.1)

## 2015-03-13 LAB — CBC WITH DIFFERENTIAL/PLATELET
Basophils Absolute: 0.1 10*3/uL (ref 0.0–0.1)
Basophils Relative: 1 % (ref 0–1)
Eosinophils Absolute: 0.4 10*3/uL (ref 0.0–0.7)
Eosinophils Relative: 7 % — ABNORMAL HIGH (ref 0–5)
HCT: 47.4 % (ref 39.0–52.0)
Hemoglobin: 16.4 g/dL (ref 13.0–17.0)
LYMPHS PCT: 37 % (ref 12–46)
Lymphs Abs: 2 10*3/uL (ref 0.7–4.0)
MCH: 32 pg (ref 26.0–34.0)
MCHC: 34.6 g/dL (ref 30.0–36.0)
MCV: 92.6 fL (ref 78.0–100.0)
MPV: 12.3 fL (ref 8.6–12.4)
Monocytes Absolute: 0.6 10*3/uL (ref 0.1–1.0)
Monocytes Relative: 11 % (ref 3–12)
NEUTROS ABS: 2.4 10*3/uL (ref 1.7–7.7)
Neutrophils Relative %: 44 % (ref 43–77)
Platelets: 130 10*3/uL — ABNORMAL LOW (ref 150–400)
RBC: 5.12 MIL/uL (ref 4.22–5.81)
RDW: 12.5 % (ref 11.5–15.5)
WBC: 5.4 10*3/uL (ref 4.0–10.5)

## 2015-03-13 NOTE — Progress Notes (Signed)
Subjective:    Patient ID: Dave Cox, male    DOB: [birth year], 74 y.o.   MRN: [id number]  HPI   Patient is here today for complete physical exam. He denies any major concerns. His last colonoscopy was 3 years ago and was normal. He has a significant family history of colon polyps and colon cancer. He currently is going every 5 years for colonoscopies. He will be due again in 2018.  He is due for prostate exam today. He is due for flu shot. He is also due for fasting lab work.   His artery was recently diagnosed with breast cancer. She was seen the oncologist later this week. His sister Mrs. Pearson passed away since I last saw him. Past Medical History  Diagnosis Date  . Cervical osteoarthritis   . CAD (coronary artery disease)   . HTN (hypertension)   . Nephrolithiasis   . Melanoma ([place])     eye  . GERD (gastroesophageal reflux disease)   . Hyperlipidemia   . Abnormal EKG    Past Surgical History  Procedure Laterality Date  . Cardiac catheterization    . Melanoma excision      eye  . Tonsillectomy    . Hernia repair    . Kidney stone surgery    . Lumbar disc surgery    . Basal cell carcinoma excision     Current Outpatient Prescriptions on File Prior to Visit  Medication Sig Dispense Refill  . albuterol (VENTOLIN HFA) 108 (90 BASE) MCG/ACT inhaler Inhale 2 puffs into the lungs 4 (four) times daily as needed for wheezing or shortness of breath. 3 Inhaler 3  . Ascorbic Acid (VITAMIN C PO) Take by mouth daily. Taking 1,000mg     . aspirin 325 MG tablet Take 325 mg by mouth daily.    . B Complex-C (SUPER B COMPLEX PO) Take by mouth daily.      . celecoxib (CELEBREX) 200 MG capsule Take 1 capsule (200 mg total) by mouth daily. 100 capsule 0  . Cholecalciferol (VITAMIN D) 1000 UNITS capsule Take 1,000 Units by mouth daily.      Marland Kitchen dutasteride (AVODART) 0.5 MG capsule Take 1 capsule (0.5 mg total) by mouth daily. 90 capsule 3  . fish oil-omega-3 fatty acids 1000 MG capsule Take  1 g by mouth daily.      . Flaxseed, Linseed, (FLAX SEED OIL PO) Take by mouth daily.      Marland Kitchen lisinopril (PRINIVIL,ZESTRIL) 20 MG tablet Take 1 tablet (20 mg total) by mouth daily. 90 tablet 3  . LORATADINE PO Take 10 mg by mouth as needed.     . mometasone (NASONEX) 50 MCG/ACT nasal spray 1-2 puffs each nostril once or twice daily 51 g 3  . Multiple Vitamin (MULTIVITAMIN) tablet Take 1 tablet by mouth daily.      Marland Kitchen NEXIUM 40 MG packet Take 40 mg by mouth daily before breakfast. 90 each 3  . niacin (NIASPAN) 1000 MG CR tablet Take 1,000 mg by mouth at bedtime.      . pyridOXINE (VITAMIN B-6) 100 MG tablet Take 100 mg by mouth daily.    . rosuvastatin (CRESTOR) 20 MG tablet Take 1 tablet (20 mg total) by mouth daily. 90 tablet 3  . vitamin B-12 (CYANOCOBALAMIN) 1000 MCG tablet Take 1,000 mcg by mouth daily.     No current facility-administered medications on file prior to visit.   No Known Allergies Social History   Social History  .  Marital Status: Married    Spouse Name: N/A  . Number of Children: N/A  . Years of Education: N/A   Occupational History  . Not on file.   Social History Main Topics  . Smoking status: Never Smoker   . Smokeless tobacco: Not on file  . Alcohol Use: Not on file  . Drug Use: Not on file  . Sexual Activity: Not on file   Other Topics Concern  . Not on file   Social History Narrative   Family History  Problem Relation Age of Onset  . Prostate cancer Father     mets  . Arthritis Father   . Hypertension Father   . Arthritis Mother   . Heart disease Mother   . Hypertension Mother   . Stroke Mother   . Lung cancer Brother     was a smoker     Review of Systems  All other systems reviewed and are negative.      Objective:   Physical Exam  Constitutional: He is oriented to person, place, and time. He appears well-developed and well-nourished. No distress.  HENT:  Head: Normocephalic and atraumatic.  Right Ear: External ear normal.    Left Ear: External ear normal.  Nose: Nose normal.  Mouth/Throat: Oropharynx is clear and moist. No oropharyngeal exudate.  Eyes: Conjunctivae and EOM are normal. Pupils are equal, round, and reactive to light. Right eye exhibits no discharge. Left eye exhibits no discharge. No scleral icterus.  Neck: Normal range of motion. Neck supple. No JVD present. No tracheal deviation present. No thyromegaly present.  Cardiovascular: Normal rate, regular rhythm, normal heart sounds and intact distal pulses.  Exam reveals no gallop and no friction rub.   No murmur heard. Pulmonary/Chest: Effort normal and breath sounds normal. No stridor. No respiratory distress. He has no wheezes. He has no rales. He exhibits no tenderness.  Abdominal: Soft. Bowel sounds are normal. He exhibits no distension and no mass. There is no tenderness. There is no rebound and no guarding.  Genitourinary: Rectum normal, prostate normal and penis normal.  Musculoskeletal: Normal range of motion. He exhibits no edema or tenderness.  Lymphadenopathy:    He has no cervical adenopathy.  Neurological: He is alert and oriented to person, place, and time. He has normal reflexes. No cranial nerve deficit. He exhibits normal muscle tone. Coordination normal.  Skin: Skin is warm. No rash noted. He is not diaphoretic. No erythema. No pallor.  Psychiatric: He has a normal mood and affect. His behavior is normal. Judgment and thought content normal.  Vitals reviewed.         Assessment & Plan:  Routine general medical examination at a health care facility - Plan: CBC with Differential/Platelet, COMPLETE METABOLIC PANEL WITH GFR, Lipid panel, PSA  Patient's physical exam is completely normal. I will check a CBC, CMP, fasting lipid panel, and PSA. Patient's cancer screening is up-to-date. He received Prevnar 13 last year.  Flu shot was given today. Regular anticipatory guidance is provided. His blood pressure is excellent. Goal LDL  cholesterol is less than 70.

## 2015-03-14 ENCOUNTER — Encounter: Payer: Self-pay | Admitting: *Deleted

## 2015-03-14 LAB — PSA: PSA: 1.36 ng/mL (ref <=4.00)

## 2015-06-08 ENCOUNTER — Telehealth: Payer: Self-pay | Admitting: Family Medicine

## 2015-06-08 NOTE — Telephone Encounter (Signed)
Patient calling to let you know that he needs you to call patient assistance at [phone] and id number is [phone] for his celebrex 200 mg  [phone] if any questions

## 2015-06-12 NOTE — Telephone Encounter (Signed)
Med refill

## 2015-06-29 MED ORDER — CELECOXIB 200 MG PO CAPS: 200.0000 mg | ORAL_CAPSULE | Freq: Every day | ORAL | Status: DC

## 2015-06-29 NOTE — Telephone Encounter (Signed)
Medication refilled with order number [id number]

## 2015-07-11 ENCOUNTER — Encounter: Payer: Self-pay | Admitting: *Deleted

## 2015-08-08 ENCOUNTER — Telehealth: Payer: Self-pay | Admitting: Internal Medicine

## 2015-08-08 MED ORDER — ALBUTEROL SULFATE HFA 108 (90 BASE) MCG/ACT IN AERS: 2.0000 | INHALATION_SPRAY | Freq: Four times a day (QID) | RESPIRATORY_TRACT | Status: DC | PRN

## 2015-08-08 NOTE — Telephone Encounter (Signed)
Rx  for  and Ventolin printed, signed, and mailed out to [facility] with Patient Assistance forms to [facility] [id number] [place], [place] [address]. Nothing more needed at this time.

## 2015-10-26 DIAGNOSIS — D225 Melanocytic nevi of trunk: Secondary | ICD-10-CM | POA: Diagnosis not present

## 2015-10-26 DIAGNOSIS — D485 Neoplasm of uncertain behavior of skin: Secondary | ICD-10-CM | POA: Diagnosis not present

## 2015-10-26 DIAGNOSIS — L820 Inflamed seborrheic keratosis: Secondary | ICD-10-CM | POA: Diagnosis not present

## 2015-10-26 DIAGNOSIS — L821 Other seborrheic keratosis: Secondary | ICD-10-CM | POA: Diagnosis not present

## 2015-10-26 DIAGNOSIS — Z85828 Personal history of other malignant neoplasm of skin: Secondary | ICD-10-CM | POA: Diagnosis not present

## 2015-11-29 ENCOUNTER — Telehealth: Payer: Self-pay | Admitting: Family Medicine

## 2015-11-29 NOTE — Telephone Encounter (Signed)
Pt is requesting that we call in for his celebrex 200 mg. [phone] Patient ID: [id number]

## 2015-11-30 NOTE — Telephone Encounter (Signed)
Called and ordered Celebrex through [facility] at [phone] and order # [id number]

## 2015-12-01 ENCOUNTER — Telehealth: Payer: Self-pay | Admitting: Internal Medicine

## 2015-12-01 NOTE — Telephone Encounter (Signed)
Please out pt in 15 min held spot for the afternoon and have them arrive at 1:30pm. They will both be seen together. Thanks.

## 2015-12-01 NOTE — Telephone Encounter (Signed)
Pt has been placed on CY's schedule at 3:30pm on 12/04/15. I have left a message with pt asking him to return my call.

## 2015-12-01 NOTE — Telephone Encounter (Signed)
Katie please advise. Thanks. 

## 2015-12-04 ENCOUNTER — Ambulatory Visit (INDEPENDENT_AMBULATORY_CARE_PROVIDER_SITE_OTHER): Payer: Medicare Other | Admitting: Internal Medicine

## 2015-12-04 ENCOUNTER — Encounter: Payer: Self-pay | Admitting: Internal Medicine

## 2015-12-04 VITALS — BP 120/72 | HR 68 | Ht 74.5 in | Wt 184.6 lb

## 2015-12-04 DIAGNOSIS — J45998 Other asthma: Secondary | ICD-10-CM | POA: Diagnosis not present

## 2015-12-04 DIAGNOSIS — K219 Gastro-esophageal reflux disease without esophagitis: Secondary | ICD-10-CM | POA: Diagnosis not present

## 2015-12-04 NOTE — Patient Instructions (Signed)
Suggest you ask your primary physician about replacing Lisinopril with something in a different drug class for 4-6 weeks, to see if the dry cough is from your ACE inhibitor  He may also want to consider ENT evaluation and possibly a barium swallow if the hoarseness and dry cough continue.

## 2015-12-04 NOTE — Progress Notes (Signed)
Subjective:    Patient ID: Dave Cox, male    DOB: Oct 11, 1940, 75 y.o.   MRN: [id number]  HPI   2/29/19- 41 yoM never smoker, followed for chronic obstructive asthma, allergic rhinitis, complicated by CAD,  GERD   Dave Cox.    FOLLOWS FOR: Pt reports breathing doing well. 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 Hx GERD and concerned about long term use of PPIs.  CXR 11/27/12-  IMPRESSION: No acute cardiopulmonary disease. Stable chest radiograph findings. Original Report Authenticated By: Markus Daft, M.D.  12/04/15-  25 yoM never smoker, followed for chronic obstructive asthma, allergic rhinitis, complicated by CAD,  GERD   Dave Cox Seen w wife FOLLOWS FOR: Pt states his breathing is doing good; uses inhaler and feels like it is working. Pt is having issues with GERD and things getting stuck in throat/windpipe-already taking GI meds. Elfers strangles easily at times when eating cereal, especially while talking. No solid food hanging up. Not aware of obvious reflux. There may also be other occasions with dry cough. No wheezing or obvious dyspnea. Continues to sing. No sleep disturbance by breathing. Uses rescue inhaler every few days.  ROS-see HPI Constitutional:   No-   weight loss, night sweats, fevers, chills, fatigue, lassitude. HEENT:   No-  headaches, difficulty swallowing, tooth/dental problems, sore throat,       No-  sneezing, itching, ear ache, nasal congestion, post nasal drip,  CV:  No-   chest pain, orthopnea, PND, swelling in lower extremities, anasarca, dizziness, palpitations Resp: No-   shortness of breath with exertion or at rest.              Mild  productive morning cough,  + non-productive cough,  No- coughing up of blood.              No-   change in color of mucus.  No- wheezing.   Skin: No-   rash or lesions. GI:  No-   heartburn, indigestion, abdominal pain, nausea, vomiting,  GU:  MS:  No-   joint pain or swelling.  . Neuro-      nothing unusual Psych:  No- change in mood or affect. No depression or anxiety.  No memory loss.  Objective:   Physical Exam General- Alert, Oriented, Affect-appropriate, Distress- none acute  Tall man Skin- rash-none, lesions- none, excoriation- none Lymphadenopathy- none Head- atraumatic            Eyes- Gross vision intact, PERRLA, conjunctivae clear secretions            Ears- Hearing, canals            Nose- Clear, no- Septal dev, mucus, polyps, erosion, perforation             Throat- Mallampati II , mucosa clear , drainage- none, tonsils- atrophic, voice normal, no thrush Neck- flexible , trachea midline, no stridor , thyroid nl, carotid no bruit Chest - symmetrical excursion , unlabored           Heart/CV- RRR , no murmur , no gallop  , no rub, nl s1 s2                           - JVD- none , edema- none, stasis changes- none, varices- none           Lung- clear to P&A, wheeze- none, cough- none , dullness-none, rub- none  Chest wall-  Abd-  Br/ Gen/ Rectal- Not done, not indicated Extrem- cyanosis- none, clubbing, none, atrophy- none, strength- nl Neuro- grossly intact to observatio  Assessment & Plan:

## 2015-12-04 NOTE — Telephone Encounter (Signed)
Spoke with pt. He is aware that he has been added on to CY's schedule. Nothing further was needed.

## 2015-12-06 ENCOUNTER — Telehealth: Payer: Self-pay | Admitting: Family Medicine

## 2015-12-06 NOTE — Telephone Encounter (Signed)
Delivery of celebrex for this patient came in on 12/06/2015, call placed to patient to come and pick up

## 2015-12-06 NOTE — Assessment & Plan Note (Addendum)
What he describes today suggests reflux/LPR. Can't exclude a component of ACE inhibitor related cough. I don't think he is describing significant cough equivalent asthma. Plan-suggested he talk with his primary physician about trying a change from ACE inhibitor to different drug class for 4-6 weeks to see if cough resolves. If not, consider ENT, barium swallow

## 2015-12-21 ENCOUNTER — Encounter: Payer: Self-pay | Admitting: Family Medicine

## 2015-12-21 ENCOUNTER — Telehealth: Payer: Self-pay | Admitting: Family Medicine

## 2015-12-21 MED ORDER — EPINEPHRINE 0.3 MG/0.3ML IJ SOAJ: 0.3000 mg | INTRAMUSCULAR | 3 refills | Status: DC | PRN

## 2015-12-21 NOTE — Telephone Encounter (Signed)
This encounter was created in error - please disregard.

## 2015-12-21 NOTE — Telephone Encounter (Signed)
Advised patient to go to ER for allergic reaction.   Patient refused.   Prescription sent to pharmacy.

## 2015-12-21 NOTE — Telephone Encounter (Signed)
Mr. Osmani called saying he was stung by a hornet about a couple of days ago and now his head, ears, and nose are swollen. In addition his chest and arms are red. He mentioned having two daughters who are Registered Nurses and they said since he's taken Benadryl, he needs an EPI Pen.  I spoke to Fulton Six, LPN, in regards to this and she said he needs to go to the hospital instead of waiting for a Rx for an EPI Pen.  Mr. Mccallie said he doesn't want to go to the hospital since his daughters said he should be ok and he just needed an EPI Pen.  Mr. Moczygemba wants the Rx sent to a [facility] in [place].  Please give him a call if needed.  Pt's ph# [phone] Thank you.w

## 2015-12-24 NOTE — Assessment & Plan Note (Signed)
He doesn't recognize active reflux much, but suspect this pattern is related to the cough he notices eating cereal. Plan-emphasis continues on reflux precautions.

## 2016-01-12 ENCOUNTER — Encounter: Payer: Self-pay | Admitting: Family Medicine

## 2016-01-12 ENCOUNTER — Ambulatory Visit (INDEPENDENT_AMBULATORY_CARE_PROVIDER_SITE_OTHER): Payer: Medicare Other | Admitting: Family Medicine

## 2016-01-12 VITALS — BP 134/92 | HR 78 | Temp 98.2°F | Resp 18 | Ht 74.5 in | Wt 184.5 lb

## 2016-01-12 DIAGNOSIS — J060 Acute laryngopharyngitis: Secondary | ICD-10-CM

## 2016-01-12 LAB — STREP GROUP A AG, W/REFLEX TO CULT: STREGTOCOCCUS GROUP A AG SCREEN: NOT DETECTED

## 2016-01-12 MED ORDER — PREDNISONE 20 MG PO TABS: ORAL_TABLET | ORAL | 0 refills | Status: DC

## 2016-01-12 MED ORDER — DOXAZOSIN MESYLATE 4 MG PO TABS: 4.0000 mg | ORAL_TABLET | Freq: Every day | ORAL | 5 refills | Status: DC

## 2016-01-12 NOTE — Progress Notes (Signed)
Subjective:    Patient ID: Dave Cox, male    DOB: Dec 30, 1940, 74 y.o.   MRN: [id number]  HPI Patient is a very pleasant 75 year old white male who presents today with 3 weeks of throat irritation and laryngitis. Symptoms primarily seem to be on the right side of his throat. He denies severe.aphasia. He denies any stridor. He denies any hemoptysis or hematemesis. He reports a scratchy throat that has persisted for more than 3 weeks. He does have postnasal drip and rhinorrhea. He denies any reflux. He is on Nexium. He denies any fevers or chills. He is not immunocompromised. He does not smoke nor drink. However he is on lisinopril 20 mg a day. He has been using nasal neck as well as loratadine for postnasal drip with some mild improvement. Physical exam today is significant only for mucosal swelling in his nostrils Past Medical History:  Diagnosis Date  . Abnormal EKG   . CAD (coronary artery disease)   . Cervical osteoarthritis   . GERD (gastroesophageal reflux disease)   . HTN (hypertension)   . Hyperlipidemia   . Melanoma ([place])    eye  . Nephrolithiasis    Past Surgical History:  Procedure Laterality Date  . BASAL CELL CARCINOMA EXCISION    . CARDIAC CATHETERIZATION    . HERNIA REPAIR    . KIDNEY STONE SURGERY    . LUMBAR DISC SURGERY    . MELANOMA EXCISION     eye  . TONSILLECTOMY     Current Outpatient Prescriptions on File Prior to Visit  Medication Sig Dispense Refill  . albuterol (VENTOLIN HFA) 108 (90 Base) MCG/ACT inhaler Inhale 2 puffs into the lungs 4 (four) times daily as needed for wheezing or shortness of breath. 3 Inhaler 3  . Ascorbic Acid (VITAMIN C PO) Take by mouth daily. Taking 1,000mg     . aspirin 325 MG tablet Take 325 mg by mouth daily.    . B Complex-C (SUPER B COMPLEX PO) Take by mouth daily.      . celecoxib (CELEBREX) 200 MG capsule Take 1 capsule (200 mg total) by mouth daily. 100 capsule 0  . Cholecalciferol (VITAMIN D) 1000 UNITS capsule Take  1,000 Units by mouth daily.      Marland Kitchen dutasteride (AVODART) 0.5 MG capsule Take 1 capsule (0.5 mg total) by mouth daily. 90 capsule 3  . EPINEPHrine 0.3 mg/0.3 mL IJ SOAJ injection Inject 0.3 mLs (0.3 mg total) into the muscle as needed. 2 Device 3  . fish oil-omega-3 fatty acids 1000 MG capsule Take 1 g by mouth daily.      . Flaxseed, Linseed, (FLAX SEED OIL PO) Take by mouth daily.      Marland Kitchen lisinopril (PRINIVIL,ZESTRIL) 20 MG tablet Take 1 tablet (20 mg total) by mouth daily. 90 tablet 3  . LORATADINE PO Take 10 mg by mouth as needed.     . mometasone (NASONEX) 50 MCG/ACT nasal spray 1-2 puffs each nostril once or twice daily 51 g 3  . Multiple Vitamin (MULTIVITAMIN) tablet Take 1 tablet by mouth daily.      Marland Kitchen NEXIUM 40 MG packet Take 40 mg by mouth daily before breakfast. 90 each 3  . pyridOXINE (VITAMIN B-6) 100 MG tablet Take 100 mg by mouth daily.    . rosuvastatin (CRESTOR) 20 MG tablet Take 1 tablet (20 mg total) by mouth daily. 90 tablet 3  . vitamin B-12 (CYANOCOBALAMIN) 1000 MCG tablet Take 1,000 mcg by mouth daily.  No current facility-administered medications on file prior to visit.    No Known Allergies Social History   Social History  . Marital status: Married    Spouse name: N/A  . Number of children: N/A  . Years of education: N/A   Occupational History  . Not on file.   Social History Main Topics  . Smoking status: Never Smoker  . Smokeless tobacco: Not on file  . Alcohol use Not on file  . Drug use: Unknown  . Sexual activity: Not on file   Other Topics Concern  . Not on file   Social History Narrative  . No narrative on file      Review of Systems  All other systems reviewed and are negative.      Objective:   Physical Exam  Constitutional: He appears well-developed and well-nourished.  HENT:  Right Ear: External ear normal.  Left Ear: External ear normal.  Nose: Mucosal edema present. No rhinorrhea. Right sinus exhibits no maxillary sinus  tenderness and no frontal sinus tenderness. Left sinus exhibits no maxillary sinus tenderness and no frontal sinus tenderness.  Mouth/Throat: Oropharynx is clear and moist. No oropharyngeal exudate.  Eyes: Conjunctivae are normal. Pupils are equal, round, and reactive to light. Right eye exhibits no discharge. Left eye exhibits no discharge.  Neck: Neck supple. No JVD present. No thyromegaly present.  Cardiovascular: Normal rate, regular rhythm, normal heart sounds and intact distal pulses.  Exam reveals no gallop and no friction rub.   No murmur heard. Pulmonary/Chest: Effort normal and breath sounds normal. No respiratory distress. He has no wheezes. He has no rales.  Lymphadenopathy:    He has no cervical adenopathy.  Vitals reviewed.         Assessment & Plan:  Sore throat and laryngitis - Plan: STREP GROUP A AG, W/REFLEX TO CULT  Differential diagnosis includes viral upper respiratory infection, Candida infection, doubt bacterial infection but I will check a strep screen, irritant laryngitis secondary to postnasal drip or acid reflux. Less likely would be pathologic lesions around the vocal cord such as nodules or cancers. Also potentially could be due to lisinopril. At the present time I will discontinue lisinopril in case this is irritating his larynx and airway. I will replace with Cardura 4 mg by mouth daily. I will treat postnasal drip by putting the patient on a prednisone taper pack temporarily. I will rule out strep throat with a rapid strep screen. Recheck in 7-10 days. If symptoms persist, consider ENT referral for laryngoscopy

## 2016-01-14 LAB — CULTURE, GROUP A STREP: ORGANISM ID, BACTERIA: NORMAL

## 2016-01-23 ENCOUNTER — Telehealth: Payer: Self-pay | Admitting: *Deleted

## 2016-01-23 DIAGNOSIS — J370 Chronic laryngitis: Secondary | ICD-10-CM

## 2016-01-23 NOTE — Telephone Encounter (Signed)
I recommend an ENT consult for laryngoscopy.  Please schedule.

## 2016-01-23 NOTE — Telephone Encounter (Signed)
Patient called and states he seen Dr. Dennard Schaumann for issues regarding his throat. He states that Dr. Dennard Schaumann said that if his throat doesn't get better to call back to the office. Patient states it is not better. He is requesting a call back. His number is [phone]. Please advise provider. Thank you .

## 2016-01-24 NOTE — Telephone Encounter (Signed)
Patient aware of providers recommendations and pt aware

## 2016-02-13 DIAGNOSIS — H25811 Combined forms of age-related cataract, right eye: Secondary | ICD-10-CM | POA: Diagnosis not present

## 2016-02-13 DIAGNOSIS — H25812 Combined forms of age-related cataract, left eye: Secondary | ICD-10-CM | POA: Diagnosis not present

## 2016-03-12 ENCOUNTER — Telehealth: Payer: Self-pay | Admitting: Family Medicine

## 2016-03-12 NOTE — Telephone Encounter (Signed)
Patient would like his celebrex refilled through [facility] assistance program  Please call if any questions  [phone]

## 2016-03-13 NOTE — Telephone Encounter (Signed)
Called and spoke to pt he does not need refill he needs new application and he does not no longer have access to a computer so he wanted [place] to fill out our part and mail him the application. Will fill out and mail to pt.

## 2016-03-14 ENCOUNTER — Encounter: Payer: Medicare Other | Admitting: Family Medicine

## 2016-03-14 NOTE — Telephone Encounter (Signed)
Application printed filled out and Dr Dennard Schaumann signed and put in mail for pt

## 2016-03-18 DIAGNOSIS — H25812 Combined forms of age-related cataract, left eye: Secondary | ICD-10-CM | POA: Diagnosis not present

## 2016-03-18 DIAGNOSIS — H2512 Age-related nuclear cataract, left eye: Secondary | ICD-10-CM | POA: Diagnosis not present

## 2016-04-04 ENCOUNTER — Telehealth: Payer: Self-pay | Admitting: Family Medicine

## 2016-04-04 MED ORDER — CELECOXIB 200 MG PO CAPS: 200.0000 mg | ORAL_CAPSULE | Freq: Every day | ORAL | 3 refills | Status: DC

## 2016-04-04 NOTE — Telephone Encounter (Signed)
Needs new rx sent to patient assistance for his Celebrex. Fax - [phone]

## 2016-04-11 ENCOUNTER — Ambulatory Visit (INDEPENDENT_AMBULATORY_CARE_PROVIDER_SITE_OTHER): Payer: Medicare Other | Admitting: Family Medicine

## 2016-04-11 ENCOUNTER — Other Ambulatory Visit: Payer: Self-pay

## 2016-04-11 ENCOUNTER — Encounter: Payer: Self-pay | Admitting: Family Medicine

## 2016-04-11 VITALS — BP 178/90 | HR 68 | Temp 97.7°F | Resp 16 | Ht 74.5 in | Wt 191.0 lb

## 2016-04-11 DIAGNOSIS — Z0000 Encounter for general adult medical examination without abnormal findings: Secondary | ICD-10-CM | POA: Diagnosis not present

## 2016-04-11 DIAGNOSIS — I10 Essential (primary) hypertension: Secondary | ICD-10-CM | POA: Diagnosis not present

## 2016-04-11 DIAGNOSIS — Z125 Encounter for screening for malignant neoplasm of prostate: Secondary | ICD-10-CM | POA: Diagnosis not present

## 2016-04-11 DIAGNOSIS — Z23 Encounter for immunization: Secondary | ICD-10-CM | POA: Diagnosis not present

## 2016-04-11 DIAGNOSIS — N400 Enlarged prostate without lower urinary tract symptoms: Secondary | ICD-10-CM | POA: Diagnosis not present

## 2016-04-11 LAB — CBC WITH DIFFERENTIAL/PLATELET
BASOS PCT: 1 %
Basophils Absolute: 46 cells/uL (ref 0–200)
EOS ABS: 276 {cells}/uL (ref 15–500)
EOS PCT: 6 %
HCT: 47.3 % (ref 38.5–50.0)
Hemoglobin: 16.1 g/dL (ref 13.0–17.0)
Lymphocytes Relative: 34 %
Lymphs Abs: 1564 cells/uL (ref 850–3900)
MCH: 31.3 pg (ref 27.0–33.0)
MCHC: 34 g/dL (ref 32.0–36.0)
MCV: 92 fL (ref 80.0–100.0)
MONO ABS: 598 {cells}/uL (ref 200–950)
MONOS PCT: 13 %
MPV: 11.8 fL (ref 7.5–12.5)
Neutro Abs: 2116 cells/uL (ref 1500–7800)
Neutrophils Relative %: 46 %
PLATELETS: 118 10*3/uL — AB (ref 140–400)
RBC: 5.14 MIL/uL (ref 4.20–5.80)
RDW: 12.7 % (ref 11.0–15.0)
WBC: 4.6 10*3/uL (ref 3.8–10.8)

## 2016-04-11 LAB — COMPLETE METABOLIC PANEL WITH GFR
ALT: 28 U/L (ref 9–46)
AST: 31 U/L (ref 10–35)
Albumin: 4.5 g/dL (ref 3.6–5.1)
Alkaline Phosphatase: 60 U/L (ref 40–115)
BILIRUBIN TOTAL: 1.2 mg/dL (ref 0.2–1.2)
BUN: 17 mg/dL (ref 7–25)
CO2: 29 mmol/L (ref 20–31)
Calcium: 9.3 mg/dL (ref 8.6–10.3)
Chloride: 104 mmol/L (ref 98–110)
Creat: 1.04 mg/dL (ref 0.70–1.18)
GFR, EST NON AFRICAN AMERICAN: 70 mL/min (ref >=60)
GFR, Est African American: 81 mL/min (ref >=60)
GLUCOSE: 100 mg/dL — AB (ref 70–99)
Potassium: 4.1 mmol/L (ref 3.5–5.3)
SODIUM: 141 mmol/L (ref 135–146)
TOTAL PROTEIN: 6.7 g/dL (ref 6.1–8.1)

## 2016-04-11 LAB — LIPID PANEL
Cholesterol: 142 mg/dL (ref <200)
HDL: 40 mg/dL — AB (ref >40)
LDL CALC: 86 mg/dL (ref <100)
Total CHOL/HDL Ratio: 3.6 Ratio (ref <5.0)
Triglycerides: 80 mg/dL (ref <150)
VLDL: 16 mg/dL (ref <30)

## 2016-04-11 LAB — PSA: PSA: 0.8 ng/mL (ref <=4.0)

## 2016-04-11 MED ORDER — LOSARTAN POTASSIUM 100 MG PO TABS: 100.0000 mg | ORAL_TABLET | Freq: Every day | ORAL | 3 refills | Status: DC

## 2016-04-11 NOTE — Progress Notes (Signed)
Subjective:    Patient ID: Dave Cox, male    DOB: 04-09-1941, 75 y.o.   MRN: [id number]  HPI  Patient is here today for complete physical exam. He denies any major concerns. His last colonoscopy was 4 years ago and was normal. He has a significant family history of colon polyps and colon cancer. He currently is going every 5 years for colonoscopies. He will be due again in 2018.  Given his age, I recommended against prostate cancer screening. However he would like to still proceed with a PSA. His blood pressure today is significantly elevated. He does have a history of white coat syndrome however even his blood pressures at home have been greater than [id number] systolic. He denies any chest pain shortness of breath or dyspnea on exertion. Immunizations are up-to-date except for his flu shot. He cannot afford the shingles vaccine and therefore he refuses it. Past Medical History:  Diagnosis Date  . Abnormal EKG   . CAD (coronary artery disease)   . Cervical osteoarthritis   . GERD (gastroesophageal reflux disease)   . HTN (hypertension)   . Hyperlipidemia   . Melanoma ([place])    eye  . Nephrolithiasis    Past Surgical History:  Procedure Laterality Date  . BASAL CELL CARCINOMA EXCISION    . CARDIAC CATHETERIZATION    . HERNIA REPAIR    . KIDNEY STONE SURGERY    . LUMBAR DISC SURGERY    . MELANOMA EXCISION     eye  . TONSILLECTOMY     Current Outpatient Prescriptions on File Prior to Visit  Medication Sig Dispense Refill  . albuterol (VENTOLIN HFA) 108 (90 Base) MCG/ACT inhaler Inhale 2 puffs into the lungs 4 (four) times daily as needed for wheezing or shortness of breath. 3 Inhaler 3  . Ascorbic Acid (VITAMIN C PO) Take by mouth daily. Taking 1,000mg     . aspirin 325 MG tablet Take 325 mg by mouth daily.    . B Complex-C (SUPER B COMPLEX PO) Take by mouth daily.      . celecoxib (CELEBREX) 200 MG capsule Take 1 capsule (200 mg total) by mouth daily. 90 capsule 3  .  Cholecalciferol (VITAMIN D) 1000 UNITS capsule Take 1,000 Units by mouth daily.      Marland Kitchen doxazosin (CARDURA) 4 MG tablet Take 1 tablet (4 mg total) by mouth daily. Stop lisinopril (Patient taking differently: Take 4 mg by mouth daily. 1.5 tab qam: 1/2 qpm) 30 tablet 5  . dutasteride (AVODART) 0.5 MG capsule Take 1 capsule (0.5 mg total) by mouth daily. 90 capsule 3  . EPINEPHrine 0.3 mg/0.3 mL IJ SOAJ injection Inject 0.3 mLs (0.3 mg total) into the muscle as needed. 2 Device 3  . fish oil-omega-3 fatty acids 1000 MG capsule Take 1 g by mouth daily.      . Flaxseed, Linseed, (FLAX SEED OIL PO) Take by mouth daily.      Marland Kitchen LORATADINE PO Take 10 mg by mouth as needed.     . mometasone (NASONEX) 50 MCG/ACT nasal spray 1-2 puffs each nostril once or twice daily 51 g 3  . Multiple Vitamin (MULTIVITAMIN) tablet Take 1 tablet by mouth daily.      Marland Kitchen NEXIUM 40 MG packet Take 40 mg by mouth daily before breakfast. 90 each 3  . predniSONE (DELTASONE) 20 MG tablet 3 tabs poqday 1-2, 2 tabs poqday 3-4, 1 tab poqday 5-6 12 tablet 0  . pyridOXINE (VITAMIN B-6) 100 MG  tablet Take 100 mg by mouth daily.    . rosuvastatin (CRESTOR) 20 MG tablet Take 1 tablet (20 mg total) by mouth daily. 90 tablet 3  . vitamin B-12 (CYANOCOBALAMIN) 1000 MCG tablet Take 1,000 mcg by mouth daily.     No current facility-administered medications on file prior to visit.    No Known Allergies Social History   Social History  . Marital status: Married    Spouse name: N/A  . Number of children: N/A  . Years of education: N/A   Occupational History  . Not on file.   Social History Main Topics  . Smoking status: Never Smoker  . Smokeless tobacco: Not on file  . Alcohol use Not on file  . Drug use: Unknown  . Sexual activity: Not on file   Other Topics Concern  . Not on file   Social History Narrative  . No narrative on file   Family History  Problem Relation Age of Onset  . Prostate cancer Father     mets  .  Arthritis Father   . Hypertension Father   . Arthritis Mother   . Heart disease Mother   . Hypertension Mother   . Stroke Mother   . Lung cancer Brother     was a smoker     Review of Systems  All other systems reviewed and are negative.      Objective:   Physical Exam  Constitutional: He is oriented to person, place, and time. He appears well-developed and well-nourished. No distress.  HENT:  Head: Normocephalic and atraumatic.  Right Ear: External ear normal.  Left Ear: External ear normal.  Nose: Nose normal.  Mouth/Throat: Oropharynx is clear and moist. No oropharyngeal exudate.  Eyes: Conjunctivae and EOM are normal. Pupils are equal, round, and reactive to light. Right eye exhibits no discharge. Left eye exhibits no discharge. No scleral icterus.  Neck: Normal range of motion. Neck supple. No JVD present. No tracheal deviation present. No thyromegaly present.  Cardiovascular: Normal rate, regular rhythm, normal heart sounds and intact distal pulses.  Exam reveals no gallop and no friction rub.   No murmur heard. Pulmonary/Chest: Effort normal and breath sounds normal. No stridor. No respiratory distress. He has no wheezes. He has no rales. He exhibits no tenderness.  Abdominal: Soft. Bowel sounds are normal. He exhibits no distension and no mass. There is no tenderness. There is no rebound and no guarding.  Musculoskeletal: Normal range of motion. He exhibits no edema or tenderness.  Lymphadenopathy:    He has no cervical adenopathy.  Neurological: He is alert and oriented to person, place, and time. He has normal reflexes. No cranial nerve deficit. He exhibits normal muscle tone. Coordination normal.  Skin: Skin is warm. No rash noted. He is not diaphoretic. No erythema. No pallor.  Psychiatric: He has a normal mood and affect. His behavior is normal. Judgment and thought content normal.  Vitals reviewed.         Assessment & Plan:  Benign essential HTN - Plan:  losartan (COZAAR) 100 MG tablet, COMPLETE METABOLIC PANEL WITH GFR, Lipid panel  Routine general medical examination at a health care facility - Plan: CBC with Differential/Platelet, COMPLETE METABOLIC PANEL WITH GFR, Lipid panel, PSA  Prostate cancer screening - Plan: PSA  Needs flu shot - Plan: Flu Vaccine QUAD 36+ mos IM Blood pressures elevated. The patient to resume Cardura 4 mg a day but also want him to start losartan 50 mg a day.  Recheck blood pressure in 2 weeks and if still elevated at that time we will increase losartan to 100 mg a day. He received his flu shot today. He declines the shingles vaccine. Colon cancer screening is up-to-date. Patient would like to draw PSA today. I will also check a CBC, CMP, fasting lipid panel. Recheck blood pressure in 2 weeks

## 2016-04-15 DIAGNOSIS — H25811 Combined forms of age-related cataract, right eye: Secondary | ICD-10-CM | POA: Diagnosis not present

## 2016-04-15 DIAGNOSIS — H2511 Age-related nuclear cataract, right eye: Secondary | ICD-10-CM | POA: Diagnosis not present

## 2016-06-18 ENCOUNTER — Telehealth: Payer: Self-pay | Admitting: Family Medicine

## 2016-06-18 NOTE — Telephone Encounter (Signed)
Needs refill on his Celebrex through patient assistance through [place] - [phone] with pt ID # [id number]  Called and ordered medication -  Order # [id number]

## 2016-06-21 ENCOUNTER — Telehealth: Payer: Self-pay | Admitting: Family Medicine

## 2016-06-21 DIAGNOSIS — N400 Enlarged prostate without lower urinary tract symptoms: Secondary | ICD-10-CM

## 2016-06-21 NOTE — Telephone Encounter (Signed)
Pt called [facility] received a bill from [employer] about a charge that he has to pay for that he has not had to do in the past and was wondering why he was getting a bill for it. Can you call pt to inquire about this?

## 2016-06-24 ENCOUNTER — Telehealth: Payer: Self-pay | Admitting: Family Medicine

## 2016-06-24 NOTE — Telephone Encounter (Signed)
Received shipment of pt's Celebrex - pt called and [facility] to pick up. Medication taken to front and put in cabinet.

## 2016-07-02 NOTE — Telephone Encounter (Signed)
I called patient and got the invoice number [id number] and billed amount $82.00 for DOS 04/11/16. I have reviewed the labs from this DOS and see that primary dx code on several labs is routine I will get the correct dx codes and call [facility].

## 2016-07-04 DIAGNOSIS — H5021 Vertical strabismus, right eye: Secondary | ICD-10-CM | POA: Diagnosis not present

## 2016-07-04 DIAGNOSIS — N400 Enlarged prostate without lower urinary tract symptoms: Secondary | ICD-10-CM | POA: Insufficient documentation

## 2016-07-04 DIAGNOSIS — H50112 Monocular exotropia, left eye: Secondary | ICD-10-CM | POA: Diagnosis not present

## 2016-07-04 NOTE — Telephone Encounter (Signed)
Codes that should have been included are E78.5, I25.10, N40.0 and Z79.899

## 2016-07-08 NOTE — Telephone Encounter (Signed)
I called and spoke with Dave Cox at [employer] she has added the codes that [name] provided below and they will resubmit claim for patient.

## 2016-07-09 NOTE — Telephone Encounter (Signed)
Patient returned my call and he is aware that the lab bill has been resubmitted with codes.

## 2016-07-25 ENCOUNTER — Other Ambulatory Visit: Payer: Self-pay | Admitting: Internal Medicine

## 2016-07-25 MED ORDER — ALBUTEROL SULFATE HFA 108 (90 BASE) MCG/ACT IN AERS: 2.0000 | INHALATION_SPRAY | Freq: Four times a day (QID) | RESPIRATORY_TRACT | 3 refills | Status: DC | PRN

## 2016-08-16 ENCOUNTER — Ambulatory Visit: Payer: Self-pay | Admitting: Ophthalmology

## 2016-08-16 NOTE — H&P (Signed)
Date of examination:  07-04-16  Indication for surgery: to straighten the eyes and allow patient to use both eyes together  Pertinent past medical history:  Past Medical History:  Diagnosis Date  . Abnormal EKG   . CAD (coronary artery disease)   . Cervical osteoarthritis   . GERD (gastroesophageal reflux disease)   . HTN (hypertension)   . Hyperlipidemia   . Melanoma ([place])    eye  . Nephrolithiasis     Pertinent ocular history:  "trouble focusing" since recent cataract surgery.  Long hx "left eye pulling out" formerly could control but not now.  Hx strab surgery age 73 in [place]  Closes OS since childhood.  Eye injury OS age 28  Pertinent family history:  Family History  Problem Relation Age of Onset  . Prostate cancer Father     mets  . Arthritis Father   . Hypertension Father   . Arthritis Mother   . Heart disease Mother   . Hypertension Mother   . Stroke Mother   . Lung cancer Brother     was a smoker    General:  Healthy appearing patient in no distress.    Eyes:    Acuity [place] OD 20/25  OS 20/20  External: Within normal limits     Anterior segment: Healed conj scars N &T OS, cyst OD  nasally  PCIOL OU  Motility:   XT=20, comitant, XT'=30.  L HoT 12 in primary, 15'   No lim of abduction OU,  DMR "hard to tell but think they're parallel"  No limitation of adduction OU  Fundus: deferred  Refraction:   mild cyl OU   Heart: Regular rate and rhythm without murmur     Lungs: Clear to auscultation       Impression:  1)XT longstanding, decompensated.   2) LHoT, ? LSO OA but no definite intorsion on DMR   3) s/p previous strabismus surgery, details unknown  Plan: (tentative) LSO posterior 7/8 tenectomyi (if LSO tight)   RLR recess vs. LLR re-recess (assuming prev. Recessed)     Derry Skill

## 2016-08-20 ENCOUNTER — Encounter (HOSPITAL_BASED_OUTPATIENT_CLINIC_OR_DEPARTMENT_OTHER): Payer: Self-pay | Admitting: *Deleted

## 2016-08-21 ENCOUNTER — Encounter (HOSPITAL_BASED_OUTPATIENT_CLINIC_OR_DEPARTMENT_OTHER)
Admission: RE | Admit: 2016-08-21 | Discharge: 2016-08-21 | Disposition: A | Discharge status: Home / Self Care | Payer: Medicare Other | Source: Ambulatory Visit | Attending: Ophthalmology | Admitting: Ophthalmology

## 2016-08-21 ENCOUNTER — Other Ambulatory Visit: Payer: Self-pay

## 2016-08-21 DIAGNOSIS — H5022 Vertical strabismus, left eye: Secondary | ICD-10-CM | POA: Diagnosis not present

## 2016-08-21 DIAGNOSIS — F419 Anxiety disorder, unspecified: Secondary | ICD-10-CM | POA: Diagnosis not present

## 2016-08-21 DIAGNOSIS — E785 Hyperlipidemia, unspecified: Secondary | ICD-10-CM | POA: Diagnosis not present

## 2016-08-21 DIAGNOSIS — I2510 Atherosclerotic heart disease of native coronary artery without angina pectoris: Secondary | ICD-10-CM | POA: Diagnosis not present

## 2016-08-21 DIAGNOSIS — Z823 Family history of stroke: Secondary | ICD-10-CM | POA: Diagnosis not present

## 2016-08-21 DIAGNOSIS — I10 Essential (primary) hypertension: Secondary | ICD-10-CM | POA: Diagnosis not present

## 2016-08-21 DIAGNOSIS — J449 Chronic obstructive pulmonary disease, unspecified: Secondary | ICD-10-CM | POA: Diagnosis not present

## 2016-08-21 DIAGNOSIS — H5021 Vertical strabismus, right eye: Secondary | ICD-10-CM | POA: Diagnosis not present

## 2016-08-21 DIAGNOSIS — Z85820 Personal history of malignant melanoma of skin: Secondary | ICD-10-CM | POA: Diagnosis not present

## 2016-08-21 DIAGNOSIS — K219 Gastro-esophageal reflux disease without esophagitis: Secondary | ICD-10-CM | POA: Diagnosis not present

## 2016-08-21 DIAGNOSIS — Z8042 Family history of malignant neoplasm of prostate: Secondary | ICD-10-CM | POA: Diagnosis not present

## 2016-08-21 DIAGNOSIS — M1990 Unspecified osteoarthritis, unspecified site: Secondary | ICD-10-CM | POA: Diagnosis not present

## 2016-08-21 DIAGNOSIS — H50112 Monocular exotropia, left eye: Secondary | ICD-10-CM | POA: Diagnosis not present

## 2016-08-21 DIAGNOSIS — Z87442 Personal history of urinary calculi: Secondary | ICD-10-CM | POA: Diagnosis not present

## 2016-08-21 DIAGNOSIS — H5010 Unspecified exotropia: Secondary | ICD-10-CM | POA: Diagnosis not present

## 2016-08-21 DIAGNOSIS — Z8261 Family history of arthritis: Secondary | ICD-10-CM | POA: Diagnosis not present

## 2016-08-21 DIAGNOSIS — Z8249 Family history of ischemic heart disease and other diseases of the circulatory system: Secondary | ICD-10-CM | POA: Diagnosis not present

## 2016-08-21 DIAGNOSIS — Z801 Family history of malignant neoplasm of trachea, bronchus and lung: Secondary | ICD-10-CM | POA: Diagnosis not present

## 2016-08-21 NOTE — Progress Notes (Signed)
Pre op EKG reviewed by Dr. Lissa Hoard. OK to proceed to sx scheduled 08/23/2016.

## 2016-08-23 ENCOUNTER — Encounter (HOSPITAL_BASED_OUTPATIENT_CLINIC_OR_DEPARTMENT_OTHER): Payer: Self-pay | Admitting: *Deleted

## 2016-08-23 ENCOUNTER — Ambulatory Visit (HOSPITAL_BASED_OUTPATIENT_CLINIC_OR_DEPARTMENT_OTHER): Payer: Medicare Other | Admitting: Anesthesiology

## 2016-08-23 ENCOUNTER — Ambulatory Visit (HOSPITAL_BASED_OUTPATIENT_CLINIC_OR_DEPARTMENT_OTHER)
Admission: RE | Admit: 2016-08-23 | Discharge: 2016-08-23 | Disposition: A | Discharge status: Home / Self Care | Payer: Medicare Other | Source: Ambulatory Visit | Attending: Ophthalmology | Admitting: Ophthalmology

## 2016-08-23 ENCOUNTER — Encounter (HOSPITAL_BASED_OUTPATIENT_CLINIC_OR_DEPARTMENT_OTHER)
Admission: RE | Disposition: A | Discharge status: Home / Self Care | Payer: Self-pay | Source: Ambulatory Visit | Attending: Ophthalmology

## 2016-08-23 DIAGNOSIS — H5010 Unspecified exotropia: Secondary | ICD-10-CM | POA: Diagnosis not present

## 2016-08-23 DIAGNOSIS — Z8249 Family history of ischemic heart disease and other diseases of the circulatory system: Secondary | ICD-10-CM | POA: Insufficient documentation

## 2016-08-23 DIAGNOSIS — K219 Gastro-esophageal reflux disease without esophagitis: Secondary | ICD-10-CM | POA: Insufficient documentation

## 2016-08-23 DIAGNOSIS — H5021 Vertical strabismus, right eye: Secondary | ICD-10-CM | POA: Diagnosis not present

## 2016-08-23 DIAGNOSIS — H50112 Monocular exotropia, left eye: Secondary | ICD-10-CM | POA: Diagnosis not present

## 2016-08-23 DIAGNOSIS — H5022 Vertical strabismus, left eye: Secondary | ICD-10-CM | POA: Insufficient documentation

## 2016-08-23 DIAGNOSIS — Z85820 Personal history of malignant melanoma of skin: Secondary | ICD-10-CM | POA: Diagnosis not present

## 2016-08-23 DIAGNOSIS — Z801 Family history of malignant neoplasm of trachea, bronchus and lung: Secondary | ICD-10-CM | POA: Insufficient documentation

## 2016-08-23 DIAGNOSIS — F419 Anxiety disorder, unspecified: Secondary | ICD-10-CM | POA: Insufficient documentation

## 2016-08-23 DIAGNOSIS — J449 Chronic obstructive pulmonary disease, unspecified: Secondary | ICD-10-CM | POA: Insufficient documentation

## 2016-08-23 DIAGNOSIS — E785 Hyperlipidemia, unspecified: Secondary | ICD-10-CM | POA: Insufficient documentation

## 2016-08-23 DIAGNOSIS — Z8261 Family history of arthritis: Secondary | ICD-10-CM | POA: Insufficient documentation

## 2016-08-23 DIAGNOSIS — Z87442 Personal history of urinary calculi: Secondary | ICD-10-CM | POA: Diagnosis not present

## 2016-08-23 DIAGNOSIS — M1990 Unspecified osteoarthritis, unspecified site: Secondary | ICD-10-CM | POA: Diagnosis not present

## 2016-08-23 DIAGNOSIS — I2510 Atherosclerotic heart disease of native coronary artery without angina pectoris: Secondary | ICD-10-CM | POA: Diagnosis not present

## 2016-08-23 DIAGNOSIS — Z8042 Family history of malignant neoplasm of prostate: Secondary | ICD-10-CM | POA: Insufficient documentation

## 2016-08-23 DIAGNOSIS — Z823 Family history of stroke: Secondary | ICD-10-CM | POA: Insufficient documentation

## 2016-08-23 DIAGNOSIS — I10 Essential (primary) hypertension: Secondary | ICD-10-CM | POA: Diagnosis not present

## 2016-08-23 HISTORY — DX: Anxiety disorder, unspecified: F41.9

## 2016-08-23 HISTORY — PX: STRABISMUS SURGERY: SHX218

## 2016-08-23 HISTORY — DX: Chronic obstructive pulmonary disease, unspecified: J44.9

## 2016-08-23 SURGERY — REPAIR STRABISMUS
Anesthesia: General | Laterality: Bilateral

## 2016-08-23 MED ORDER — HYDROCODONE-ACETAMINOPHEN 5-325 MG PO TABS
1.0000 | ORAL_TABLET | Freq: Once | ORAL | Status: AC | PRN
  Administered 2016-08-23: 1 via ORAL

## 2016-08-23 MED ORDER — LACTATED RINGERS IV SOLN
INTRAVENOUS | Status: DC
  Administered 2016-08-23: 10 mL/h via INTRAVENOUS

## 2016-08-23 MED ORDER — EPHEDRINE 5 MG/ML INJ
INTRAVENOUS | Status: AC
  Filled 2016-08-23: qty 10

## 2016-08-23 MED ORDER — SCOPOLAMINE 1 MG/3DAYS TD PT72: 1.0000 | MEDICATED_PATCH | Freq: Once | TRANSDERMAL | Status: DC | PRN

## 2016-08-23 MED ORDER — PHENYLEPHRINE HCL 10 MG/ML IJ SOLN
INTRAMUSCULAR | Status: AC
  Filled 2016-08-23: qty 1

## 2016-08-23 MED ORDER — TOBRAMYCIN-DEXAMETHASONE 0.3-0.1 % OP OINT
TOPICAL_OINTMENT | OPHTHALMIC | Status: DC | PRN
  Administered 2016-08-23: 1 via OPHTHALMIC

## 2016-08-23 MED ORDER — HYDROCODONE-ACETAMINOPHEN 5-325 MG PO TABS
ORAL_TABLET | ORAL | Status: AC
  Filled 2016-08-23: qty 1

## 2016-08-23 MED ORDER — ONDANSETRON HCL 4 MG/2ML IJ SOLN
INTRAMUSCULAR | Status: AC
  Filled 2016-08-23: qty 2

## 2016-08-23 MED ORDER — FENTANYL CITRATE (PF) 100 MCG/2ML IJ SOLN
INTRAMUSCULAR | Status: AC
  Filled 2016-08-23: qty 2

## 2016-08-23 MED ORDER — BSS IO SOLN
INTRAOCULAR | Status: DC | PRN
  Administered 2016-08-23: 15 mL

## 2016-08-23 MED ORDER — ONDANSETRON HCL 4 MG/2ML IJ SOLN: 4.0000 mg | Freq: Once | INTRAMUSCULAR | Status: DC | PRN

## 2016-08-23 MED ORDER — EPHEDRINE SULFATE 50 MG/ML IJ SOLN
INTRAMUSCULAR | Status: DC | PRN
  Administered 2016-08-23: 15 mg via INTRAVENOUS

## 2016-08-23 MED ORDER — PROPOFOL 10 MG/ML IV BOLUS
INTRAVENOUS | Status: DC | PRN
  Administered 2016-08-23: 150 mg via INTRAVENOUS

## 2016-08-23 MED ORDER — DEXAMETHASONE SODIUM PHOSPHATE 10 MG/ML IJ SOLN
INTRAMUSCULAR | Status: AC
  Filled 2016-08-23: qty 1

## 2016-08-23 MED ORDER — GLYCOPYRROLATE 0.2 MG/ML IJ SOLN
INTRAMUSCULAR | Status: DC | PRN
  Administered 2016-08-23 (×2): .2 mg via INTRAVENOUS

## 2016-08-23 MED ORDER — MIDAZOLAM HCL 2 MG/2ML IJ SOLN: 1.0000 mg | INTRAMUSCULAR | Status: DC | PRN

## 2016-08-23 MED ORDER — LIDOCAINE 2% (20 MG/ML) 5 ML SYRINGE
INTRAMUSCULAR | Status: DC | PRN
  Administered 2016-08-23: 50 mg via INTRAVENOUS

## 2016-08-23 MED ORDER — LIDOCAINE 2% (20 MG/ML) 5 ML SYRINGE
INTRAMUSCULAR | Status: AC
  Filled 2016-08-23: qty 5

## 2016-08-23 MED ORDER — TOBRAMYCIN-DEXAMETHASONE 0.3-0.1 % OP OINT: 1.0000 "application " | TOPICAL_OINTMENT | Freq: Two times a day (BID) | OPHTHALMIC | 0 refills | Status: DC

## 2016-08-23 MED ORDER — FENTANYL CITRATE (PF) 100 MCG/2ML IJ SOLN
50.0000 ug | INTRAMUSCULAR | Status: DC | PRN
  Administered 2016-08-23: 100 ug via INTRAVENOUS

## 2016-08-23 MED ORDER — DEXAMETHASONE SODIUM PHOSPHATE 4 MG/ML IJ SOLN
INTRAMUSCULAR | Status: DC | PRN
  Administered 2016-08-23: 10 mg via INTRAVENOUS

## 2016-08-23 MED ORDER — PHENYLEPHRINE HCL 10 MG/ML IJ SOLN
INTRAMUSCULAR | Status: DC | PRN
  Administered 2016-08-23 (×3): 40 ug via INTRAVENOUS

## 2016-08-23 MED ORDER — FENTANYL CITRATE (PF) 100 MCG/2ML IJ SOLN
25.0000 ug | INTRAMUSCULAR | Status: DC | PRN
  Administered 2016-08-23: 50 ug via INTRAVENOUS
  Administered 2016-08-23: 25 ug via INTRAVENOUS

## 2016-08-23 SURGICAL SUPPLY — 32 items
APPLICATOR COTTON TIP 6IN STRL (MISCELLANEOUS) ×12 IMPLANT
APPLICATOR DR MATTHEWS STRL (MISCELLANEOUS) ×3 IMPLANT
BANDAGE EYE OVAL (MISCELLANEOUS) IMPLANT
CAUTERY EYE LOW TEMP 1300F FIN (OPHTHALMIC RELATED) IMPLANT
CLOSURE WOUND 1/4X4 (GAUZE/BANDAGES/DRESSINGS)
COVER BACK TABLE 60X90IN (DRAPES) ×3 IMPLANT
COVER MAYO STAND STRL (DRAPES) ×3 IMPLANT
DRAPE SURG 17X23 STRL (DRAPES) ×6 IMPLANT
DRAPE U-SHAPE 76X120 STRL (DRAPES) ×3 IMPLANT
GLOVE BIO SURGEON STRL SZ 6.5 (GLOVE) ×4 IMPLANT
GLOVE BIO SURGEONS STRL SZ 6.5 (GLOVE) ×2
GLOVE BIOGEL M STRL SZ7.5 (GLOVE) ×6 IMPLANT
GOWN STRL REUS W/ TWL LRG LVL3 (GOWN DISPOSABLE) ×1 IMPLANT
GOWN STRL REUS W/TWL LRG LVL3 (GOWN DISPOSABLE) ×2
GOWN STRL REUS W/TWL XL LVL3 (GOWN DISPOSABLE) ×6 IMPLANT
NS IRRIG 1000ML POUR BTL (IV SOLUTION) ×3 IMPLANT
PACK BASIN DAY SURGERY FS (CUSTOM PROCEDURE TRAY) ×3 IMPLANT
SHEET MEDIUM DRAPE 40X70 STRL (DRAPES) IMPLANT
SLEEVE SCD COMPRESS KNEE MED (MISCELLANEOUS) ×3 IMPLANT
SPEAR EYE SURG WECK-CEL (MISCELLANEOUS) ×6 IMPLANT
STRIP CLOSURE SKIN 1/4X4 (GAUZE/BANDAGES/DRESSINGS) IMPLANT
SUT 6 0 SILK T G140 8DA (SUTURE) ×6 IMPLANT
SUT MERSILENE 6-0 18IN S14 8MM (SUTURE)
SUT PLAIN 6 0 TG1408 (SUTURE) ×3 IMPLANT
SUT SILK 4 0 C 3 735G (SUTURE) IMPLANT
SUT VICRYL 6 0 S 28 (SUTURE) ×3 IMPLANT
SUT VICRYL ABS 6-0 S29 18IN (SUTURE) ×3 IMPLANT
SUTURE MERSLN 6-0 18IN S14 8MM (SUTURE) IMPLANT
SYR 10ML LL (SYRINGE) ×3 IMPLANT
SYR TB 1ML LL NO SAFETY (SYRINGE) ×3 IMPLANT
TOWEL OR 17X24 6PK STRL BLUE (TOWEL DISPOSABLE) ×3 IMPLANT
TRAY DSU PREP LF (CUSTOM PROCEDURE TRAY) ×3 IMPLANT

## 2016-08-23 NOTE — Anesthesia Preprocedure Evaluation (Addendum)
Anesthesia Evaluation  Patient identified by MRN, date of birth, ID band Patient awake    Reviewed: Allergy & Precautions, NPO status , Patient's Chart, lab work & pertinent test results  Airway Mallampati: II  TM Distance: <3 FB Neck ROM: Limited    Dental  (+) Teeth Intact, Dental Advisory Given   Pulmonary asthma , COPD,  COPD inhaler,    Pulmonary exam normal breath sounds clear to auscultation       Cardiovascular hypertension, Pt. on medications + CAD  Normal cardiovascular exam Rhythm:Regular Rate:Normal     Neuro/Psych PSYCHIATRIC DISORDERS Anxiety negative neurological ROS     GI/Hepatic Neg liver ROS, GERD  Medicated and Controlled,  Endo/Other  negative endocrine ROS  Renal/GU negative Renal ROS     Musculoskeletal  (+) Arthritis , Osteoarthritis,    Abdominal   Peds  Hematology negative hematology ROS (+)   Anesthesia Other Findings Day of surgery medications reviewed with the patient.  Reproductive/Obstetrics                           Anesthesia Physical Anesthesia Plan  ASA: III  Anesthesia Plan: General   Post-op Pain Management:    Induction: Intravenous  Airway Management Planned: LMA  Additional Equipment:   Intra-op Plan:   Post-operative Plan: Extubation in OR  Informed Consent: I have reviewed the patients History and Physical, chart, labs and discussed the procedure including the risks, benefits and alternatives for the proposed anesthesia with the patient or authorized representative who has indicated his/her understanding and acceptance.   Dental advisory given  Plan Discussed with: CRNA  Anesthesia Plan Comments:         Anesthesia Quick Evaluation

## 2016-08-23 NOTE — Discharge Instructions (Signed)
Diet: Clear liquids, advance to soft foods then regular diet as tolerated by this evening.  Pain control:   1)  Ibuprofen 600 mg by mouth every 6-8 hours as needed for pain  2)  Ice pack/cold compress to operated eye(s) as desired  Eye medications:    Tobradex or Zylet eye ointment 1/2 inch in operated eye(s) twice a day   Activity: No swimming for 1 week.  It is OK to let water run over the face and eyes while showering or taking a bath, even during the first week.  No other restriction on exercise or activity.    Call Dr. Janee Morn office [phone] with any problems or concerns.   Post Anesthesia Home Care Instructions  Activity: Get plenty of rest for the remainder of the day. A responsible individual must stay with you for 24 hours following the procedure.  For the next 24 hours, DO NOT: -Drive a car -Paediatric nurse -Drink alcoholic beverages -Take any medication unless instructed by your physician -Make any legal decisions or sign important papers.  Meals: Start with liquid foods such as gelatin or soup. Progress to regular foods as tolerated. Avoid greasy, spicy, heavy foods. If nausea and/or vomiting occur, drink only clear liquids until the nausea and/or vomiting subsides. Call your physician if vomiting continues.  Special Instructions/Symptoms: Your throat may feel dry or sore from the anesthesia or the breathing tube placed in your throat during surgery. If this causes discomfort, gargle with warm salt water. The discomfort should disappear within 24 hours.  If you had a scopolamine patch placed behind your ear for the management of post- operative nausea and/or vomiting:  1. The medication in the patch is effective for 72 hours, after which it should be removed.  Wrap patch in a tissue and discard in the trash. Wash hands thoroughly with soap and water. 2. You may remove the patch earlier than 72 hours if you experience unpleasant side effects which may include dry  mouth, dizziness or visual disturbances. 3. Avoid touching the patch. Wash your hands with soap and water after contact with the patch.

## 2016-08-23 NOTE — Op Note (Signed)
08/23/2016  12:38 PM  PATIENT:  Dave Cox    PRE-OPERATIVE DIAGNOSIS:  1.EXOTROPIA   2. LEFT HYPOTROPIA   3.  HISTORY OF PREVIOUS STRABISMUS SURGERY DETAILS UNKNOWN  POST-OPERATIVE DIAGNOSIS:  same  PROCEDURE:  1.  Posterior 7/8 tenotomy left superior oblique    2/  Right lateral rectus muscle recession 5.5 mm   3.  Right medial rectus muscle resection 6.5 mm  SURGEON:  Derry Skill, MD  ANESTHESIA:   General  COMPLICATIONS: none  OPERATIVE PROCEDURE: After routine preoperative evaluation including informed consent, the patient was taken to the operating room where he was identified by me. General anesthesia was induced without difficulty after placement of appropriate monitors. The patient was prepped and draped in standard sterile fashion. A lid speculum was placed in left eye.  Through a superotemporal fornix incision through conjunctiva and Tenon's fascia, the left superior rectus muscle was engaged on a series of muscle hooks. A Barbie retractor was introduced into the conjunctival incision and drawn posteriorly. With the body of the superior rectus muscle pulled nasally with a small hook, the insertion of the superior oblique tendon was visualized. The tendon was engaged on oblique hook. The anterior 1/8 of the insertion was separated from the posterior 7 eights with a small hook. The posterior 7 eights of the tendon was severed with Wescott scissors. The conjunctival incision was closed with a single 6-0 plain gut suture.  The conjunctiva over the left medial and lateral rectus rectus muscle appeared scarred, consistent with previous surgery. The right medial and lateral rectus muscle were then inspected, and there is no evidence of conjunctival scarring. Through a Swan incision over the insertion, the right lateral rectus muscle was engaged on a series of muscle hooks and cleared of its fascial attachments. The tendon was secured with a double-armed 6-0 Vicryl suture, with a  locking bite at each border of the muscle, 1 mm from the insertion. The muscle was disinserted, and was reattached to sclera at a measured distance of 5.5 mm posterior to the original insertion, using direct scleral passes in crossed swords fashion per the suture ends were tied securely after the position of the muscle been checked and found to be accurate. Conjunctiva was closed with a single 6-0 plain gut suture. Through a Swan incision over the medial rectus insertion, the medial rectus muscle the right medial rectus muscle was engaged on a series of muscle hooks and carefully cleared of its fascial attachments. The muscle was spread between 2 self-retaining hooks. A 2 mm bite was taken of the center of the muscle belly with a double-armed 6-0 Vicryl suture at a measured distance of 6.5 mm posterior to the insertion. A knot was tied securely at this location. The needle at each end of the double-armed suture was passed from the center of the muscle belly to the periphery, parallel to and 6.5 mm posterior to the insertion. A locking bite was placed at each border of the muscle at this location. A resection clamps placed on the muscle to anteriorly just anteriorly to the sutures. The muscle was disinserted. Each pole suture was passed posteriorly to anteriorly through the corresponding end of the muscle stump, then anteriorly to posteriorly near the center of the stump, then posteriorly to anteriorly through the center of the muscle belly, just posterior to the previously placed knot. The muscle was drawn up to level of the original insertion. All slack was removed. The clamp was removed. The suture ends  were tied securely. The portion of the muscle anterior to the sutures was carefully excised. Conjunctiva was closed with a single 6-0 plain gut suture. Tobradex ointment was placed in each eye. The patient was awakened without difficulty and taken to the recovery room in stable condition, having suffered no  intraoperative or immediate postoperative complications  Derry Skill, MD

## 2016-08-23 NOTE — H&P (Signed)
Interval History and Physical Examination:  Dave Cox  08/23/2016  Date of Initial H&P: 07-04-16   The patient has been reexamined. The H&P has been reviewed. There is no change in the plan of care.  The patient has no new complaints. The indications for today's procedure remain valid. There are no medical contraindications for proceeding with today's planned surgery and we will proceed as planned.  [name] OMD

## 2016-08-23 NOTE — Anesthesia Procedure Notes (Signed)
Procedure Name: LMA Insertion Performed by: Edyn Qazi W Pre-anesthesia Checklist: Patient identified, Emergency Drugs available, Suction available and Patient being monitored Patient Re-evaluated:Patient Re-evaluated prior to inductionOxygen Delivery Method: Circle system utilized Preoxygenation: Pre-oxygenation with 100% oxygen Intubation Type: IV induction Ventilation: Mask ventilation without difficulty LMA: LMA flexible inserted LMA Size: 4.0 Number of attempts: 1 Placement Confirmation: positive ETCO2 Tube secured with: Tape Dental Injury: Teeth and Oropharynx as per pre-operative assessment        

## 2016-08-23 NOTE — Interval H&P Note (Signed)
History and Physical Interval Note:  08/23/2016 11:03 AM  Dave Cox  has presented today for surgery, with the diagnosis of EXOTROPIA and left hypotropia The various methods of treatment have been discussed with the patient and family. After consideration of risks, benefits and other options for treatment, the patient has consented to  Eye muscle surgery left eye or both eyes as a surgical intervention .  The patient's history has been reviewed, patient examined, no change in status, stable for surgery.  I have reviewed the patient's chart and labs.  Questions were answered to the patient's satisfaction.     Derry Skill

## 2016-08-23 NOTE — H&P (View-Only) (Signed)
Date of examination:  07-04-16  Indication for surgery: to straighten the eyes and allow patient to use both eyes together  Pertinent past medical history:  Past Medical History:  Diagnosis Date  . Abnormal EKG   . CAD (coronary artery disease)   . Cervical osteoarthritis   . GERD (gastroesophageal reflux disease)   . HTN (hypertension)   . Hyperlipidemia   . Melanoma ([place])    eye  . Nephrolithiasis     Pertinent ocular history:  "trouble focusing" since recent cataract surgery.  Long hx "left eye pulling out" formerly could control but not now.  Hx strab surgery age 76 in [place]  Closes OS since childhood.  Eye injury OS age 58  Pertinent family history:  Family History  Problem Relation Age of Onset  . Prostate cancer Father     mets  . Arthritis Father   . Hypertension Father   . Arthritis Mother   . Heart disease Mother   . Hypertension Mother   . Stroke Mother   . Lung cancer Brother     was a smoker    General:  Healthy appearing patient in no distress.    Eyes:    Acuity [place] OD 20/25  OS 20/20  External: Within normal limits     Anterior segment: Healed conj scars N &T OS, cyst OD  nasally  PCIOL OU  Motility:   XT=20, comitant, XT'=30.  L HoT 12 in primary, 15'   No lim of abduction OU,  DMR "hard to tell but think they're parallel"  No limitation of adduction OU  Fundus: deferred  Refraction:   mild cyl OU   Heart: Regular rate and rhythm without murmur     Lungs: Clear to auscultation       Impression:  1)XT longstanding, decompensated.   2) LHoT, ? LSO OA but no definite intorsion on DMR   3) s/p previous strabismus surgery, details unknown  Plan: (tentative) LSO posterior 7/8 tenectomyi (if LSO tight)   RLR recess vs. LLR re-recess (assuming prev. Recessed)     Derry Skill

## 2016-08-23 NOTE — Transfer of Care (Signed)
Immediate Anesthesia Transfer of Care Note  Patient: Dave Cox  Procedure(s) Performed: Procedure(s): REPAIR STRABISMUS (Bilateral)  Patient Location: PACU  Anesthesia Type:General  Level of Consciousness: awake and sedated  Airway & Oxygen Therapy: Patient Spontanous Breathing and Patient connected to face mask oxygen  Post-op Assessment: Report given to RN and Post -op Vital signs reviewed and stable  Post vital signs: Reviewed and stable  Last Vitals:  Vitals:   08/23/16 0903 08/23/16 1237  BP: 135/79 (!) 160/112  Pulse: 64 (!) 124  Resp: 16   Temp: 36.5 C     Last Pain:  Vitals:   08/23/16 0903  TempSrc: Oral         Complications: No apparent anesthesia complications

## 2016-08-23 NOTE — Anesthesia Postprocedure Evaluation (Signed)
Anesthesia Post Note  Patient: Dave Cox  Procedure(s) Performed: Procedure(s) (LRB): REPAIR STRABISMUS (Bilateral)  Patient location during evaluation: PACU Anesthesia Type: General Level of consciousness: awake and alert Pain management: pain level controlled Vital Signs Assessment: post-procedure vital signs reviewed and stable Respiratory status: spontaneous breathing, nonlabored ventilation, respiratory function stable and patient connected to nasal cannula oxygen Cardiovascular status: blood pressure returned to baseline and stable Postop Assessment: no signs of nausea or vomiting Anesthetic complications: no       Last Vitals:  Vitals:   08/23/16 1300 08/23/16 1315  BP: (!) 154/92 (!) 166/96  Pulse: 91 98  Resp: 10 13  Temp:      Last Pain:  Vitals:   08/23/16 1313  TempSrc:   PainSc: [place]

## 2016-08-26 ENCOUNTER — Encounter (HOSPITAL_BASED_OUTPATIENT_CLINIC_OR_DEPARTMENT_OTHER): Payer: Self-pay | Admitting: Ophthalmology

## 2016-10-04 ENCOUNTER — Telehealth: Payer: Self-pay | Admitting: Family Medicine

## 2016-10-04 NOTE — Telephone Encounter (Signed)
Pt needs refill on celebrex, and needs [place] to call the rx assistance number [phone]

## 2016-10-08 NOTE — Telephone Encounter (Signed)
Celebrex through patient assistance through [place] - [phone] with pt ID # [id number]  Called and ordered medication -  Order # [id number] Will take up to 10 business days to receive.

## 2016-10-11 NOTE — Telephone Encounter (Signed)
Medication arrived today and pt made aware to come p/u. Medication left up front.

## 2016-10-31 DIAGNOSIS — D3610 Benign neoplasm of peripheral nerves and autonomic nervous system, unspecified: Secondary | ICD-10-CM | POA: Diagnosis not present

## 2016-10-31 DIAGNOSIS — L821 Other seborrheic keratosis: Secondary | ICD-10-CM | POA: Diagnosis not present

## 2016-10-31 DIAGNOSIS — L111 Transient acantholytic dermatosis [Grover]: Secondary | ICD-10-CM | POA: Diagnosis not present

## 2016-10-31 DIAGNOSIS — D225 Melanocytic nevi of trunk: Secondary | ICD-10-CM | POA: Diagnosis not present

## 2016-10-31 DIAGNOSIS — Z85828 Personal history of other malignant neoplasm of skin: Secondary | ICD-10-CM | POA: Diagnosis not present

## 2017-01-17 ENCOUNTER — Encounter: Payer: Self-pay | Admitting: Family Medicine

## 2017-01-17 ENCOUNTER — Ambulatory Visit (INDEPENDENT_AMBULATORY_CARE_PROVIDER_SITE_OTHER): Payer: Medicare Other | Admitting: Family Medicine

## 2017-01-17 VITALS — BP 132/78 | HR 72 | Temp 98.2°F | Resp 14 | Ht 74.5 in | Wt 186.0 lb

## 2017-01-17 DIAGNOSIS — L820 Inflamed seborrheic keratosis: Secondary | ICD-10-CM

## 2017-01-17 DIAGNOSIS — L111 Transient acantholytic dermatosis [Grover]: Secondary | ICD-10-CM | POA: Diagnosis not present

## 2017-01-17 MED ORDER — MOMETASONE FUROATE 0.1 % EX OINT: TOPICAL_OINTMENT | Freq: Every day | CUTANEOUS | 3 refills | Status: DC

## 2017-01-17 MED ORDER — HYDROXYZINE HCL 25 MG PO TABS: 25.0000 mg | ORAL_TABLET | Freq: Three times a day (TID) | ORAL | 0 refills | Status: DC | PRN

## 2017-01-17 NOTE — Progress Notes (Signed)
Subjective:    Patient ID: Dave Cox, male    DOB: Aug 13, 1940, 76 y.o.   MRN: [id number]  HPI Patient has 2 concerns. #1 he has a diffuse widespread rash all over his upper trunk. The rash consists of erythematous small papules 2-3 mm in diameter. He also has diffuse widespread itching. 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 He is interested in options for treatment. Second issue is a papule on the right side of his neck. It is 5 mm in diameter. It is a wartlike papule that is irritated and itchy. It appears to be a seborrheic keratosis that is irritated. He would like me to remove that. Past Medical History:  Diagnosis Date  . Abnormal EKG   . Anxiety   . CAD (coronary artery disease)   . Cervical osteoarthritis   . COPD (chronic obstructive pulmonary disease) ([place])   . GERD (gastroesophageal reflux disease)   . HTN (hypertension)   . Hyperlipidemia   . Melanoma ([place])    eye  . Nephrolithiasis    Past Surgical History:  Procedure Laterality Date  . BASAL CELL CARCINOMA EXCISION    . CARDIAC CATHETERIZATION    . HERNIA REPAIR    . KIDNEY STONE SURGERY    . LUMBAR DISC SURGERY    . MELANOMA EXCISION     eye  . STRABISMUS SURGERY Bilateral 08/23/2016   Procedure: REPAIR STRABISMUS;  Surgeon: [name], MD;  Location: [facility];  Service: Ophthalmology;  Laterality: Bilateral;  . TONSILLECTOMY     Current Outpatient Prescriptions on File Prior to Visit  Medication Sig Dispense Refill  . albuterol (VENTOLIN HFA) 108 (90 Base) MCG/ACT inhaler Inhale 2 puffs into the lungs 4 (four) times daily as needed for wheezing or shortness of breath. 3 Inhaler 3  . Ascorbic Acid (VITAMIN C PO) Take by mouth daily. Taking 1,000mg     . aspirin EC 81 MG tablet Take 81 mg by mouth daily.    . B Complex-C (SUPER B COMPLEX PO) Take by mouth daily.      . celecoxib (CELEBREX) 200 MG capsule Take 1 capsule (200 mg total) by mouth daily. 90 capsule 3  . doxazosin (CARDURA) 4 MG  tablet Take 1 tablet (4 mg total) by mouth daily. Stop lisinopril (Patient taking differently: Take 4 mg by mouth daily. 1.5 tab qam: 1/2 qpm) 30 tablet 5  . EPINEPHrine 0.3 mg/0.3 mL IJ SOAJ injection Inject 0.3 mLs (0.3 mg total) into the muscle as needed. 2 Device 3  . fish oil-omega-3 fatty acids 1000 MG capsule Take 1 g by mouth daily.      . Flaxseed, Linseed, (FLAX SEED OIL PO) Take by mouth daily.      Marland Kitchen LORATADINE PO Take 10 mg by mouth as needed.     Marland Kitchen losartan (COZAAR) 100 MG tablet Take 1 tablet (100 mg total) by mouth daily. (Patient taking differently: Take 50 mg by mouth daily. ) 90 tablet 3  . mometasone (NASONEX) 50 MCG/ACT nasal spray 1-2 puffs each nostril once or twice daily 51 g 3  . Multiple Vitamin (MULTIVITAMIN) tablet Take 1 tablet by mouth daily.      Marland Kitchen NEXIUM 40 MG packet Take 40 mg by mouth daily before breakfast. 90 each 3  . pyridOXINE (VITAMIN B-6) 100 MG tablet Take 100 mg by mouth daily.    . rosuvastatin (CRESTOR) 20 MG tablet Take 1 tablet (20 mg total) by mouth daily. 90 tablet 3  .  tobramycin-dexamethasone (TOBRADEX) ophthalmic ointment Place 1 application into both eyes 2 (two) times daily. 3.5 g 0   No current facility-administered medications on file prior to visit.    Allergies  Allergen Reactions  . Bee Venom    Social History   Social History  . Marital status: Married    Spouse name: N/A  . Number of children: N/A  . Years of education: N/A   Occupational History  . Not on file.   Social History Main Topics  . Smoking status: Never Smoker  . Smokeless tobacco: Never Used  . Alcohol use No  . Drug use: No  . Sexual activity: Not on file   Other Topics Concern  . Not on file   Social History Narrative  . No narrative on file      Review of Systems  All other systems reviewed and are negative.      Objective:   Physical Exam  Cardiovascular: Normal rate, regular rhythm and normal heart sounds.   Pulmonary/Chest: Effort  normal and breath sounds normal. No respiratory distress. He has no wheezes. He has no rales.  Skin: Rash noted. There is erythema.  Vitals reviewed.         Assessment & Plan:  Grover's disease - Plan: mometasone (ELOCON) 0.1 % ointment, hydrOXYzine (ATARAX/VISTARIL) 25 MG tablet  Seborrheic keratoses, inflamed  We'll treat Grover's disease with Elocon ointment applied at night and hydroxyzine 25 mg by mouth every 8 hours when necessary itching. The lesion on the right side of his neck was anesthetized 0.1% lidocaine with epinephrine and removed with the scalpel and hemostasis was achieved with Drysol and a Band-Aid. I will refer the patient to a dermatologist for possible phototherapy for Grover's disease.

## 2017-02-06 DIAGNOSIS — L309 Dermatitis, unspecified: Secondary | ICD-10-CM | POA: Diagnosis not present

## 2017-02-06 DIAGNOSIS — L986 Other infiltrative disorders of the skin and subcutaneous tissue: Secondary | ICD-10-CM | POA: Diagnosis not present

## 2017-02-06 DIAGNOSIS — Z23 Encounter for immunization: Secondary | ICD-10-CM | POA: Diagnosis not present

## 2017-02-17 ENCOUNTER — Ambulatory Visit (INDEPENDENT_AMBULATORY_CARE_PROVIDER_SITE_OTHER): Payer: Medicare Other

## 2017-02-17 ENCOUNTER — Ambulatory Visit: Payer: No Typology Code available for payment source | Admitting: Internal Medicine

## 2017-02-17 DIAGNOSIS — Z23 Encounter for immunization: Secondary | ICD-10-CM

## 2017-02-20 DIAGNOSIS — L309 Dermatitis, unspecified: Secondary | ICD-10-CM | POA: Diagnosis not present

## 2017-02-20 DIAGNOSIS — T7840XA Allergy, unspecified, initial encounter: Secondary | ICD-10-CM | POA: Diagnosis not present

## 2017-02-21 ENCOUNTER — Telehealth: Payer: Self-pay | Admitting: Family Medicine

## 2017-02-21 NOTE — Telephone Encounter (Signed)
Needs refill on his Celebrex through patient assistance through [place] - [phone] with pt ID # [id number]  Medication ordered and will arrive in 7 - 10 business days.

## 2017-02-27 NOTE — Telephone Encounter (Signed)
meds arrived and pt aware to p/u

## 2017-03-18 ENCOUNTER — Ambulatory Visit (INDEPENDENT_AMBULATORY_CARE_PROVIDER_SITE_OTHER): Payer: Medicare Other | Admitting: Family Medicine

## 2017-03-18 ENCOUNTER — Encounter: Payer: Self-pay | Admitting: Family Medicine

## 2017-03-18 VITALS — BP 136/88 | HR 68 | Temp 97.9°F | Resp 14 | Ht 74.5 in | Wt 190.0 lb

## 2017-03-18 DIAGNOSIS — Z125 Encounter for screening for malignant neoplasm of prostate: Secondary | ICD-10-CM | POA: Diagnosis not present

## 2017-03-18 DIAGNOSIS — L111 Transient acantholytic dermatosis [Grover]: Secondary | ICD-10-CM

## 2017-03-18 DIAGNOSIS — E7800 Pure hypercholesterolemia, unspecified: Secondary | ICD-10-CM

## 2017-03-18 DIAGNOSIS — I10 Essential (primary) hypertension: Secondary | ICD-10-CM | POA: Diagnosis not present

## 2017-03-18 DIAGNOSIS — Z0000 Encounter for general adult medical examination without abnormal findings: Secondary | ICD-10-CM

## 2017-03-18 LAB — COMPLETE METABOLIC PANEL WITH GFR
AG Ratio: 2 (calc) (ref 1.0–2.5)
ALBUMIN MSPROF: 4.6 g/dL (ref 3.6–5.1)
ALT: 27 U/L (ref 9–46)
AST: 28 U/L (ref 10–35)
Alkaline phosphatase (APISO): 70 U/L (ref 40–115)
BUN: 16 mg/dL (ref 7–25)
CALCIUM: 9.8 mg/dL (ref 8.6–10.3)
CO2: 31 mmol/L (ref 20–32)
CREATININE: 1.03 mg/dL (ref 0.70–1.18)
Chloride: 101 mmol/L (ref 98–110)
GFR, EST AFRICAN AMERICAN: 81 mL/min/{1.73_m2} (ref >=60)
GFR, EST NON AFRICAN AMERICAN: 70 mL/min/{1.73_m2} (ref >=60)
Globulin: 2.3 g/dL (calc) (ref 1.9–3.7)
Glucose, Bld: 97 mg/dL (ref 65–99)
Potassium: 4.3 mmol/L (ref 3.5–5.3)
SODIUM: 139 mmol/L (ref 135–146)
TOTAL PROTEIN: 6.9 g/dL (ref 6.1–8.1)
Total Bilirubin: 0.9 mg/dL (ref 0.2–1.2)

## 2017-03-18 LAB — PSA: PSA: 1.2 ng/mL (ref <=4.0)

## 2017-03-18 LAB — LIPID PANEL
Cholesterol: 153 mg/dL (ref <200)
HDL: 42 mg/dL (ref >40)
LDL Cholesterol (Calc): 93 mg/dL (calc)
NON-HDL CHOLESTEROL (CALC): 111 mg/dL (ref <130)
Total CHOL/HDL Ratio: 3.6 (calc) (ref <5.0)
Triglycerides: 85 mg/dL (ref <150)

## 2017-03-18 MED ORDER — CALCIPOTRIENE 0.005 % EX CREA: TOPICAL_CREAM | Freq: Two times a day (BID) | CUTANEOUS | 1 refills | Status: DC

## 2017-03-18 NOTE — Progress Notes (Signed)
Subjective:    Patient ID: Dave Cox, male    DOB: Oct 26, 1940, 76 y.o.   MRN: [id number]  HPI  Patient is here today for complete physical exam. He denies any major concerns.  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  He is interested in other options.  His last colonoscopy was 5 years ago and was normal. He has a significant family history of colon polyps and colon cancer. He currently is going every 5 years for colonoscopies. He will be due again in 2018.  Given his age, I recommended against prostate cancer screening. However he would like to still proceed with a PSA. His blood pressure is better controlled now on losartan.  He denies any chest pain shortness of breath or dyspnea on exertion.  His immunizations are up-to-date except for the shingles vaccine.  However cost is an issue for the shingles vaccine and he has declined it to date. Past Medical History:  Diagnosis Date  . Abnormal EKG   . Anxiety   . CAD (coronary artery disease)   . Cervical osteoarthritis   . COPD (chronic obstructive pulmonary disease) ([place])   . GERD (gastroesophageal reflux disease)   . HTN (hypertension)   . Hyperlipidemia   . Melanoma ([place])    eye  . Nephrolithiasis    Past Surgical History:  Procedure Laterality Date  . BASAL CELL CARCINOMA EXCISION    . CARDIAC CATHETERIZATION    . HERNIA REPAIR    . KIDNEY STONE SURGERY    . LUMBAR DISC SURGERY    . MELANOMA EXCISION     eye  . TONSILLECTOMY     Current Outpatient Medications on File Prior to Visit  Medication Sig Dispense Refill  . albuterol (VENTOLIN HFA) 108 (90 Base) MCG/ACT inhaler Inhale 2 puffs into the lungs 4 (four) times daily as needed for wheezing or shortness of breath. 3 Inhaler 3  . Ascorbic Acid (VITAMIN C PO) Take by mouth daily. Taking 1,000mg     . aspirin EC 81 MG tablet Take 81 mg by mouth daily.    . B Complex-C (SUPER B COMPLEX PO) Take by  mouth daily.      . celecoxib (CELEBREX) 200 MG capsule Take 1 capsule (200 mg total) by mouth daily. 90 capsule 3  . doxazosin (CARDURA) 4 MG tablet Take 1 tablet (4 mg total) by mouth daily. Stop lisinopril (Patient taking differently: Take 4 mg by mouth daily. 1.5 tab qam: 1/2 qpm) 30 tablet 5  . EPINEPHrine 0.3 mg/0.3 mL IJ SOAJ injection Inject 0.3 mLs (0.3 mg total) into the muscle as needed. 2 Device 3  . fish oil-omega-3 fatty acids 1000 MG capsule Take 1 g by mouth daily.      . Flaxseed, Linseed, (FLAX SEED OIL PO) Take by mouth daily.      . hydrOXYzine (ATARAX/VISTARIL) 25 MG tablet Take 1 tablet (25 mg total) by mouth 3 (three) times daily as needed. 30 tablet 0  . LORATADINE PO Take 10 mg by mouth as needed.     Marland Kitchen losartan (COZAAR) 100 MG tablet Take 1 tablet (100 mg total) by mouth daily. (Patient taking differently: Take 50 mg by mouth daily. ) 90 tablet 3  . mometasone (ELOCON) 0.1 % ointment Apply topically daily. 45 g 3  . mometasone (NASONEX) 50 MCG/ACT nasal spray 1-2 puffs each nostril once or twice daily 51 g 3  . Multiple Vitamin (  MULTIVITAMIN) tablet Take 1 tablet by mouth daily.      Marland Kitchen NEXIUM 40 MG packet Take 40 mg by mouth daily before breakfast. 90 each 3  . pyridOXINE (VITAMIN B-6) 100 MG tablet Take 100 mg by mouth daily.    . rosuvastatin (CRESTOR) 20 MG tablet Take 1 tablet (20 mg total) by mouth daily. 90 tablet 3  . tobramycin-dexamethasone (TOBRADEX) ophthalmic ointment Place 1 application into both eyes 2 (two) times daily. 3.5 g 0  . triamcinolone cream (KENALOG) 0.1 % apply topically twice daily FOR 14 DAYS  0   No current facility-administered medications on file prior to visit.    Allergies  Allergen Reactions  . Bee Venom    Social History   Socioeconomic History  . Marital status: Married    Spouse name: Not on file  . Number of children: Not on file  . Years of education: Not on file  . Highest education level: Not on file  Social Needs  .  Financial resource strain: Not on file  . Food insecurity - worry: Not on file  . Food insecurity - inability: Not on file  . Transportation needs - medical: Not on file  . Transportation needs - non-medical: Not on file  Occupational History  . Not on file  Tobacco Use  . Smoking status: Never Smoker  . Smokeless tobacco: Never Used  Substance and Sexual Activity  . Alcohol use: No  . Drug use: No  . Sexual activity: Not on file  Other Topics Concern  . Not on file  Social History Narrative  . Not on file   Family History  Problem Relation Age of Onset  . Prostate cancer Father        mets  . Arthritis Father   . Hypertension Father   . Arthritis Mother   . Heart disease Mother   . Hypertension Mother   . Stroke Mother   . Lung cancer Brother        was a smoker     Review of Systems  All other systems reviewed and are negative.      Objective:   Physical Exam  Constitutional: He is oriented to person, place, and time. He appears well-developed and well-nourished. No distress.  HENT:  Head: Normocephalic and atraumatic.  Right Ear: External ear normal.  Left Ear: External ear normal.  Nose: Nose normal.  Mouth/Throat: Oropharynx is clear and moist. No oropharyngeal exudate.  Eyes: Conjunctivae and EOM are normal. Pupils are equal, round, and reactive to light. Right eye exhibits no discharge. Left eye exhibits no discharge. No scleral icterus.  Neck: Normal range of motion. Neck supple. No JVD present. No tracheal deviation present. No thyromegaly present.  Cardiovascular: Normal rate, regular rhythm, normal heart sounds and intact distal pulses. Exam reveals no gallop and no friction rub.  No murmur heard. Pulmonary/Chest: Effort normal and breath sounds normal. No stridor. No respiratory distress. He has no wheezes. He has no rales. He exhibits no tenderness.  Abdominal: Soft. Bowel sounds are normal. He exhibits no distension and no mass. There is no  tenderness. There is no rebound and no guarding.  Musculoskeletal: Normal range of motion. He exhibits no edema or tenderness.  Lymphadenopathy:    He has no cervical adenopathy.  Neurological: He is alert and oriented to person, place, and time. He has normal reflexes. No cranial nerve deficit. He exhibits normal muscle tone. Coordination normal.  Skin: Skin is warm. No rash noted.  He is not diaphoretic. No erythema. No pallor.  Psychiatric: He has a normal mood and affect. His behavior is normal. Judgment and thought content normal.  Vitals reviewed.         Assessment & Plan:  Pure hypercholesterolemia - Plan: COMPLETE METABOLIC PANEL WITH GFR, Lipid panel  Prostate cancer screening - Plan: PSA  Benign essential HTN  Routine general medical examination at a health care facility  Grover's disease I am very happy with his blood pressure.  Colonoscopy is up-to-date.  Screen for prostate cancer with PSA.  I will also check a fasting lipid panel.  Given his history of coronary artery disease, his goal LDL cholesterol is less than 70.  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  I have recommended trying Dovonex cream twice daily for 3-4 weeks.  If not improving at that time, consider a trial of UV light therapy through dermatology.  If patient elects to proceed with shingles shot he can come by any time for that.  On a personal note, the patient was just elected to the [place].  He is very proud of this as he should be.

## 2017-03-24 ENCOUNTER — Encounter: Payer: Self-pay | Admitting: Family Medicine

## 2017-04-10 ENCOUNTER — Other Ambulatory Visit: Payer: Self-pay

## 2017-04-23 DIAGNOSIS — L309 Dermatitis, unspecified: Secondary | ICD-10-CM | POA: Diagnosis not present

## 2017-05-15 DIAGNOSIS — H5032 Intermittent alternating esotropia: Secondary | ICD-10-CM | POA: Diagnosis not present

## 2017-05-15 DIAGNOSIS — H5021 Vertical strabismus, right eye: Secondary | ICD-10-CM | POA: Diagnosis not present

## 2017-05-30 ENCOUNTER — Ambulatory Visit: Payer: Self-pay | Admitting: Ophthalmology

## 2017-06-02 ENCOUNTER — Encounter (HOSPITAL_BASED_OUTPATIENT_CLINIC_OR_DEPARTMENT_OTHER): Payer: Self-pay | Admitting: *Deleted

## 2017-06-03 ENCOUNTER — Ambulatory Visit: Payer: Self-pay | Admitting: Ophthalmology

## 2017-06-03 NOTE — H&P (Signed)
Date of examination:  05-15-17  Indication for surgery: Residual left hypotropia and esotropia, causing double vision  Pertinent past medical history:  Past Medical History:  Diagnosis Date  . Abnormal EKG   . Anxiety   . CAD (coronary artery disease)   . Cervical osteoarthritis   . COPD (chronic obstructive pulmonary disease) ([place])   . GERD (gastroesophageal reflux disease)   . HTN (hypertension)   . Hyperlipidemia   . Melanoma ([place])    eye  . Nephrolithiasis     Pertinent ocular history:  4/18 RLR recess/RMR resect, posterior 7/8 tenotomy of LSO.  Previous strabismus surgery at age 7 details unknown, ? For XT  Pertinent family history:  Family History  Problem Relation Age of Onset  . Prostate cancer Father        mets  . Arthritis Father   . Hypertension Father   . Arthritis Mother   . Heart disease Mother   . Hypertension Mother   . Stroke Mother   . Lung cancer Brother        was a smoker    General:  Healthy appearing patient in no distress.    Eyes:    Acuity [place]  OD 20/25  OS 20/20  External: Within normal limits     Anterior segment: Within normal limits x healed conj scars and PCIOL OU  Motility:   L HoT 15, ET=8 (but XT' 8), 2- LIO, fuses with 12 BU OS and 6 BO (not BI)  Fundus:deferred  Refraction:  [place] SE OU approx  Heart: Regular rate and rhythm without murmur     Lungs: Clear to auscultation     Impression: 1. Residual left hypotropia and incyclotropia s/p posterior 7/8 tenotomy of LSO  2. 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 LSO full tenotomy, ? From nasal side of SR  2. Partial tendon recession of left medial rectus muscle (pt prefers that surgery be confined to one eye)  Derry Skill

## 2017-06-03 NOTE — H&P (View-Only) (Signed)
Date of examination:  05-15-17  Indication for surgery: Residual left hypotropia and esotropia, causing double vision  Pertinent past medical history:  Past Medical History:  Diagnosis Date  . Abnormal EKG   . Anxiety   . CAD (coronary artery disease)   . Cervical osteoarthritis   . COPD (chronic obstructive pulmonary disease) ([place])   . GERD (gastroesophageal reflux disease)   . HTN (hypertension)   . Hyperlipidemia   . Melanoma ([place])    eye  . Nephrolithiasis     Pertinent ocular history:  4/18 RLR recess/RMR resect, posterior 7/8 tenotomy of LSO.  Previous strabismus surgery at age 70 details unknown, ? For XT  Pertinent family history:  Family History  Problem Relation Age of Onset  . Prostate cancer Father        mets  . Arthritis Father   . Hypertension Father   . Arthritis Mother   . Heart disease Mother   . Hypertension Mother   . Stroke Mother   . Lung cancer Brother        was a smoker    General:  Healthy appearing patient in no distress.    Eyes:    Acuity [place]  OD 20/25  OS 20/20  External: Within normal limits     Anterior segment: Within normal limits x healed conj scars and PCIOL OU  Motility:   L HoT 15, ET=8 (but XT' 8), 2- LIO, fuses with 12 BU OS and 6 BO (not BI)  Fundus:deferred  Refraction:  [place] SE OU approx  Heart: Regular rate and rhythm without murmur     Lungs: Clear to auscultation     Impression: 1. Residual left hypotropia and incyclotropia s/p posterior 7/8 tenotomy of LSO  2. 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 LSO full tenotomy, ? From nasal side of SR  2. Partial tendon recession of left medial rectus muscle (pt prefers that surgery be confined to one eye)  Derry Skill

## 2017-06-06 ENCOUNTER — Ambulatory Visit (HOSPITAL_BASED_OUTPATIENT_CLINIC_OR_DEPARTMENT_OTHER)
Admission: RE | Admit: 2017-06-06 | Discharge: 2017-06-06 | Disposition: A | Discharge status: Home / Self Care | Payer: Medicare Other | Source: Ambulatory Visit | Attending: Ophthalmology | Admitting: Ophthalmology

## 2017-06-06 ENCOUNTER — Encounter (HOSPITAL_BASED_OUTPATIENT_CLINIC_OR_DEPARTMENT_OTHER)
Admission: RE | Disposition: A | Discharge status: Home / Self Care | Payer: Self-pay | Source: Ambulatory Visit | Attending: Ophthalmology

## 2017-06-06 ENCOUNTER — Encounter (HOSPITAL_BASED_OUTPATIENT_CLINIC_OR_DEPARTMENT_OTHER): Payer: Self-pay | Admitting: *Deleted

## 2017-06-06 ENCOUNTER — Other Ambulatory Visit: Payer: Self-pay

## 2017-06-06 ENCOUNTER — Ambulatory Visit (HOSPITAL_BASED_OUTPATIENT_CLINIC_OR_DEPARTMENT_OTHER): Payer: Medicare Other | Admitting: Anesthesiology

## 2017-06-06 DIAGNOSIS — J449 Chronic obstructive pulmonary disease, unspecified: Secondary | ICD-10-CM | POA: Diagnosis not present

## 2017-06-06 DIAGNOSIS — Z801 Family history of malignant neoplasm of trachea, bronchus and lung: Secondary | ICD-10-CM | POA: Diagnosis not present

## 2017-06-06 DIAGNOSIS — K219 Gastro-esophageal reflux disease without esophagitis: Secondary | ICD-10-CM | POA: Insufficient documentation

## 2017-06-06 DIAGNOSIS — Z791 Long term (current) use of non-steroidal anti-inflammatories (NSAID): Secondary | ICD-10-CM | POA: Diagnosis not present

## 2017-06-06 DIAGNOSIS — H532 Diplopia: Secondary | ICD-10-CM | POA: Insufficient documentation

## 2017-06-06 DIAGNOSIS — Z87442 Personal history of urinary calculi: Secondary | ICD-10-CM | POA: Diagnosis not present

## 2017-06-06 DIAGNOSIS — Z85840 Personal history of malignant neoplasm of eye: Secondary | ICD-10-CM | POA: Diagnosis not present

## 2017-06-06 DIAGNOSIS — I10 Essential (primary) hypertension: Secondary | ICD-10-CM | POA: Insufficient documentation

## 2017-06-06 DIAGNOSIS — H5000 Unspecified esotropia: Secondary | ICD-10-CM | POA: Insufficient documentation

## 2017-06-06 DIAGNOSIS — H5021 Vertical strabismus, right eye: Secondary | ICD-10-CM | POA: Diagnosis not present

## 2017-06-06 DIAGNOSIS — H5005 Alternating esotropia: Secondary | ICD-10-CM | POA: Diagnosis not present

## 2017-06-06 DIAGNOSIS — I2510 Atherosclerotic heart disease of native coronary artery without angina pectoris: Secondary | ICD-10-CM | POA: Diagnosis not present

## 2017-06-06 DIAGNOSIS — Z79899 Other long term (current) drug therapy: Secondary | ICD-10-CM | POA: Diagnosis not present

## 2017-06-06 DIAGNOSIS — Z9889 Other specified postprocedural states: Secondary | ICD-10-CM | POA: Diagnosis not present

## 2017-06-06 DIAGNOSIS — H5022 Vertical strabismus, left eye: Secondary | ICD-10-CM | POA: Diagnosis not present

## 2017-06-06 DIAGNOSIS — H50412 Cyclotropia, left eye: Secondary | ICD-10-CM | POA: Insufficient documentation

## 2017-06-06 DIAGNOSIS — E785 Hyperlipidemia, unspecified: Secondary | ICD-10-CM | POA: Insufficient documentation

## 2017-06-06 DIAGNOSIS — Z8261 Family history of arthritis: Secondary | ICD-10-CM | POA: Diagnosis not present

## 2017-06-06 DIAGNOSIS — M479 Spondylosis, unspecified: Secondary | ICD-10-CM | POA: Diagnosis not present

## 2017-06-06 DIAGNOSIS — Z8249 Family history of ischemic heart disease and other diseases of the circulatory system: Secondary | ICD-10-CM | POA: Diagnosis not present

## 2017-06-06 DIAGNOSIS — Z8042 Family history of malignant neoplasm of prostate: Secondary | ICD-10-CM | POA: Insufficient documentation

## 2017-06-06 DIAGNOSIS — H5054 Cyclophoria: Secondary | ICD-10-CM | POA: Diagnosis not present

## 2017-06-06 DIAGNOSIS — Z823 Family history of stroke: Secondary | ICD-10-CM | POA: Diagnosis not present

## 2017-06-06 HISTORY — PX: STRABISMUS SURGERY: SHX218

## 2017-06-06 SURGERY — REPAIR STRABISMUS
Anesthesia: General | Site: Eye | Laterality: Left

## 2017-06-06 MED ORDER — LACTATED RINGERS IV SOLN
INTRAVENOUS | Status: DC
  Administered 2017-06-06: 08:00:00 via INTRAVENOUS

## 2017-06-06 MED ORDER — FENTANYL CITRATE (PF) 100 MCG/2ML IJ SOLN
50.0000 ug | INTRAMUSCULAR | Status: DC | PRN
  Administered 2017-06-06 (×2): 50 ug via INTRAVENOUS

## 2017-06-06 MED ORDER — LIDOCAINE 2% (20 MG/ML) 5 ML SYRINGE
INTRAMUSCULAR | Status: AC
  Filled 2017-06-06: qty 5

## 2017-06-06 MED ORDER — DEXAMETHASONE SODIUM PHOSPHATE 4 MG/ML IJ SOLN
INTRAMUSCULAR | Status: DC | PRN
  Administered 2017-06-06: 8 mg via INTRAVENOUS

## 2017-06-06 MED ORDER — GLYCOPYRROLATE 0.2 MG/ML IJ SOLN
INTRAMUSCULAR | Status: DC | PRN
  Administered 2017-06-06: 0.3 mg via INTRAVENOUS

## 2017-06-06 MED ORDER — PROPOFOL 10 MG/ML IV BOLUS
INTRAVENOUS | Status: DC | PRN
  Administered 2017-06-06: 15 mg via INTRAVENOUS

## 2017-06-06 MED ORDER — MIDAZOLAM HCL 2 MG/2ML IJ SOLN: 1.0000 mg | INTRAMUSCULAR | Status: DC | PRN

## 2017-06-06 MED ORDER — EPHEDRINE 5 MG/ML INJ
INTRAVENOUS | Status: AC
  Filled 2017-06-06: qty 10

## 2017-06-06 MED ORDER — LABETALOL HCL 5 MG/ML IV SOLN
INTRAVENOUS | Status: DC | PRN
  Administered 2017-06-06: 5 mg via INTRAVENOUS

## 2017-06-06 MED ORDER — ONDANSETRON HCL 4 MG/2ML IJ SOLN
INTRAMUSCULAR | Status: DC | PRN
  Administered 2017-06-06: 4 mg via INTRAVENOUS

## 2017-06-06 MED ORDER — FENTANYL CITRATE (PF) 100 MCG/2ML IJ SOLN
INTRAMUSCULAR | Status: AC
  Filled 2017-06-06: qty 2

## 2017-06-06 MED ORDER — PHENYLEPHRINE 40 MCG/ML (10ML) SYRINGE FOR IV PUSH (FOR BLOOD PRESSURE SUPPORT)
PREFILLED_SYRINGE | INTRAVENOUS | Status: AC
  Filled 2017-06-06: qty 10

## 2017-06-06 MED ORDER — DEXAMETHASONE SODIUM PHOSPHATE 10 MG/ML IJ SOLN
INTRAMUSCULAR | Status: AC
  Filled 2017-06-06: qty 1

## 2017-06-06 MED ORDER — LIDOCAINE HCL (CARDIAC) 20 MG/ML IV SOLN
INTRAVENOUS | Status: DC | PRN
  Administered 2017-06-06: 50 mg via INTRAVENOUS

## 2017-06-06 MED ORDER — KETOROLAC TROMETHAMINE 30 MG/ML IJ SOLN
INTRAMUSCULAR | Status: AC
  Filled 2017-06-06: qty 1

## 2017-06-06 MED ORDER — SCOPOLAMINE 1 MG/3DAYS TD PT72: 1.0000 | MEDICATED_PATCH | Freq: Once | TRANSDERMAL | Status: DC | PRN

## 2017-06-06 MED ORDER — EPHEDRINE SULFATE 50 MG/ML IJ SOLN
INTRAMUSCULAR | Status: DC | PRN
  Administered 2017-06-06: 10 mg via INTRAVENOUS

## 2017-06-06 MED ORDER — LABETALOL HCL 5 MG/ML IV SOLN
INTRAVENOUS | Status: AC
  Filled 2017-06-06: qty 4

## 2017-06-06 MED ORDER — TOBRAMYCIN-DEXAMETHASONE 0.3-0.1 % OP OINT
TOPICAL_OINTMENT | OPHTHALMIC | Status: DC | PRN
  Administered 2017-06-06: 1 via OPHTHALMIC

## 2017-06-06 MED ORDER — SUCCINYLCHOLINE CHLORIDE 200 MG/10ML IV SOSY
PREFILLED_SYRINGE | INTRAVENOUS | Status: AC
  Filled 2017-06-06: qty 10

## 2017-06-06 MED ORDER — ONDANSETRON HCL 4 MG/2ML IJ SOLN
INTRAMUSCULAR | Status: AC
  Filled 2017-06-06: qty 2

## 2017-06-06 SURGICAL SUPPLY — 31 items
APPLICATOR COTTON TIP 6IN STRL (MISCELLANEOUS) ×12 IMPLANT
APPLICATOR DR MATTHEWS STRL (MISCELLANEOUS) ×3 IMPLANT
BANDAGE EYE OVAL (MISCELLANEOUS) IMPLANT
CAUTERY EYE LOW TEMP 1300F FIN (OPHTHALMIC RELATED) IMPLANT
CLOSURE WOUND 1/4X4 (GAUZE/BANDAGES/DRESSINGS)
COVER BACK TABLE 60X90IN (DRAPES) ×3 IMPLANT
COVER MAYO STAND STRL (DRAPES) ×3 IMPLANT
DRAPE SURG 17X23 STRL (DRAPES) ×6 IMPLANT
DRAPE U-SHAPE 76X120 STRL (DRAPES) ×3 IMPLANT
GLOVE BIO SURGEON STRL SZ 6.5 (GLOVE) ×4 IMPLANT
GLOVE BIO SURGEONS STRL SZ 6.5 (GLOVE) ×2
GLOVE BIOGEL M STRL SZ7.5 (GLOVE) ×6 IMPLANT
GOWN STRL REUS W/ TWL LRG LVL3 (GOWN DISPOSABLE) ×1 IMPLANT
GOWN STRL REUS W/TWL LRG LVL3 (GOWN DISPOSABLE) ×2
GOWN STRL REUS W/TWL XL LVL3 (GOWN DISPOSABLE) ×6 IMPLANT
NS IRRIG 1000ML POUR BTL (IV SOLUTION) ×3 IMPLANT
PACK BASIN DAY SURGERY FS (CUSTOM PROCEDURE TRAY) ×3 IMPLANT
SHEET MEDIUM DRAPE 40X70 STRL (DRAPES) IMPLANT
SPEAR EYE SURG WECK-CEL (MISCELLANEOUS) ×6 IMPLANT
STRIP CLOSURE SKIN 1/4X4 (GAUZE/BANDAGES/DRESSINGS) IMPLANT
SUT 6 0 SILK T G140 8DA (SUTURE) ×3 IMPLANT
SUT MERSILENE 6-0 18IN S14 8MM (SUTURE)
SUT PLAIN 6 0 TG1408 (SUTURE) ×3 IMPLANT
SUT SILK 4 0 C 3 735G (SUTURE) IMPLANT
SUT VICRYL 6 0 S 28 (SUTURE) IMPLANT
SUT VICRYL ABS 6-0 S29 18IN (SUTURE) ×3 IMPLANT
SUTURE MERSLN 6-0 18IN S14 8MM (SUTURE) IMPLANT
SYR 10ML LL (SYRINGE) ×3 IMPLANT
SYR TB 1ML LL NO SAFETY (SYRINGE) ×3 IMPLANT
TOWEL OR 17X24 6PK STRL BLUE (TOWEL DISPOSABLE) ×3 IMPLANT
TRAY DSU PREP LF (CUSTOM PROCEDURE TRAY) ×3 IMPLANT

## 2017-06-06 NOTE — Discharge Instructions (Signed)
Diet: Clear liquids, advance to soft foods then regular diet as tolerated by this evening. ° °Pain control:  ° 1)  Ibuprofen 600 mg by mouth every 6-8 hours as needed for pain ° 2)  Ice pack/cold compress to operated eye(s) as desired ° °Eye medications:  Tobradex or Zylet eye ointment 1/2 inch in operated eye(s) twice a day if directed to do so by Dr. Young ° °Activity: No swimming for 1 week.  It is OK to let water run over the face and eyes while showering or taking a bath, even during the first week.  No other restriction on exercise or activity. ° ° °Call Dr. Young's office [phone] with any problems or concerns. ° ° ° ° °Post Anesthesia Home Care Instructions ° °Activity: °Get plenty of rest for the remainder of the day. A responsible individual must stay with you for 24 hours following the procedure.  °For the next 24 hours, DO NOT: °-Drive a car °-Operate machinery °-Drink alcoholic beverages °-Take any medication unless instructed by your physician °-Make any legal decisions or sign important papers. ° °Meals: °Start with liquid foods such as gelatin or soup. Progress to regular foods as tolerated. Avoid greasy, spicy, heavy foods. If nausea and/or vomiting occur, drink only clear liquids until the nausea and/or vomiting subsides. Call your physician if vomiting continues. ° °Special Instructions/Symptoms: °Your throat may feel dry or sore from the anesthesia or the breathing tube placed in your throat during surgery. If this causes discomfort, gargle with warm salt water. The discomfort should disappear within 24 hours. ° °If you had a scopolamine patch placed behind your ear for the management of post- operative nausea and/or vomiting: ° °1. The medication in the patch is effective for 72 hours, after which it should be removed.  Wrap patch in a tissue and discard in the trash. Wash hands thoroughly with soap and water. °2. You may remove the patch earlier than 72 hours if you experience unpleasant  side effects which may include dry mouth, dizziness or visual disturbances. °3. Avoid touching the patch. Wash your hands with soap and water after contact with the patch. °   ° °

## 2017-06-06 NOTE — Anesthesia Postprocedure Evaluation (Signed)
Anesthesia Post Note  Patient: EMBER GOTTWALD  Procedure(s) Performed: STRABISMUS REPAIR (Left Eye)     Patient location during evaluation: PACU Anesthesia Type: General Level of consciousness: awake and alert Pain management: pain level controlled Vital Signs Assessment: post-procedure vital signs reviewed and stable Respiratory status: spontaneous breathing, nonlabored ventilation and respiratory function stable Cardiovascular status: blood pressure returned to baseline and stable Postop Assessment: no apparent nausea or vomiting Anesthetic complications: no    Last Vitals:  Vitals:   06/06/17 1215 06/06/17 1230  BP: 129/90 136/78  Pulse: 88 91  Resp: (!) 9 16  Temp:  (!) 36.4 C  SpO2: 98% 96%    Last Pain:  Vitals:   06/06/17 1230  TempSrc:   PainSc: 0-No pain                 Lynda Rainwater

## 2017-06-06 NOTE — Interval H&P Note (Signed)
History and Physical Interval Note:  06/06/2017 10:20 AM  Dave Cox  has presented today for surgery, with the diagnosis of LEFT HYPOTROPIA and esotropia. The various methods of treatment have been discussed with the patient and family. After consideration of risks, benefits and other options for treatment, the patient has consented to  Procedure(s): REPAIR STRABISMUS (Left) as a surgical intervention .  The patient's history has been reviewed, patient examined, no change in status, stable for surgery.  I have reviewed the patient's chart and labs.  Questions were answered to the patient's satisfaction.     Derry Skill

## 2017-06-06 NOTE — Anesthesia Procedure Notes (Signed)
Procedure Name: LMA Insertion Date/Time: 06/06/2017 10:43 AM Performed by: Willa Frater, CRNA Pre-anesthesia Checklist: Patient identified, Emergency Drugs available, Suction available and Patient being monitored Patient Re-evaluated:Patient Re-evaluated prior to induction Oxygen Delivery Method: Circle system utilized Preoxygenation: Pre-oxygenation with 100% oxygen Induction Type: IV induction Ventilation: Mask ventilation without difficulty LMA: LMA inserted LMA Size: 4.0 Number of attempts: 1 Airway Equipment and Method: Bite block Placement Confirmation: positive ETCO2 Tube secured with: Tape Dental Injury: Teeth and Oropharynx as per pre-operative assessment

## 2017-06-06 NOTE — Transfer of Care (Signed)
Immediate Anesthesia Transfer of Care Note  Patient: Dave Cox  Procedure(s) Performed: STRABISMUS REPAIR (Left Eye)  Patient Location: PACU  Anesthesia Type:General  Level of Consciousness: awake and drowsy  Airway & Oxygen Therapy: Patient Spontanous Breathing and Patient connected to face mask oxygen  Post-op Assessment: Report given to RN and Post -op Vital signs reviewed and stable  Post vital signs: Reviewed and stable  Last Vitals:  Vitals:   06/06/17 0815 06/06/17 1154  BP: (!) 169/89 128/90  Pulse: 62 (!) 101  Resp: 18 10  Temp: 36.6 C (P) 36.5 C  SpO2: 98% 98%    Last Pain:  Vitals:   06/06/17 0815  TempSrc: Oral      Patients Stated Pain Goal: 0 (01/75/10 2585)  Complications: No apparent anesthesia complications

## 2017-06-06 NOTE — Op Note (Signed)
06/06/2017  11:46 AM  PATIENT:  Dave Cox  77 y.o. male  PRE-OPERATIVE DIAGNOSIS:  1.  Left hypotropia with incyclotropia     2. Esotropia    POST-OPERATIVE DIAGNOSIS:  same  PROCEDURE:  1.  Left superior oblique nasal tenotomy   2. Medial rectus muscle partial tendon recession  4.0 mm left eye  SURGEON:  Lorne Skeens.Annamaria Boots, M.D.   ANESTHESIA:   general  COMPLICATIONS:None  DESCRIPTION OF PROCEDURE: The patient was taken to the operating room where He was identified by me. General anesthesia was induced without difficulty after placement of appropriate monitors. The patient was prepped and draped in standard sterile fashion. A lid speculum was placed in each eye.  Exaggerated forced traction of the left superior oblique tendon was performed, confirming that the superior oblique tendon was tight.  Through a superonasal fornix incision through conjunctiva and Tenon fascia, the left superior rectus muscle was engaged on a series of muscle hooks. A Desmarres retractor was introduced into the conjunctival incision and drawn posteriorly, exposing the superior oblique tendon along the nasal border of the superior rectus muscle. The tendon was engaged on oblique hook, taking care to ensure that all fibers had been engaged. The tendon was severed with W[place]ott scissors. Exaggerated forced traction testing was repeated and felt to be free.   Through the same conjunctival incision, the left medial rectus muscle was engaged on a series of muscle hook.. The superior pole of the tendon was secured with a double-armed 6-0 Vicryl suture with a  locking bite, 1 mm from the insertion. The muscle was disinserted from the superior end of the insertion, leaving the inferior 2 mm of the insertion intact.  The superior pole was reattached to sclera at a measured distance of 4.0 millimeters posterior to the original insertion, a direct scleral pass.  The suture ends were tied securely after the position of the  muscle had been checked and found to be accurate. Conjunctiva was closed with 2 6-0 plain gutl sutures.  TobraDex ointment was placed in the left eye. The patient was awakened without difficulty and taken to the recovery room in stable condition, having suffered no intraoperative or immediate postoperative complications.  Lorne Skeens. Dave Cox M.D.    PATIENT DISPOSITION:  PACU - hemodynamically stable.

## 2017-06-06 NOTE — Anesthesia Preprocedure Evaluation (Signed)
Anesthesia Evaluation  Patient identified by MRN, date of birth, ID band Patient awake    Reviewed: Allergy & Precautions, NPO status , Patient's Chart, lab work & pertinent test results  Airway Mallampati: II  TM Distance: <3 FB Neck ROM: Limited    Dental  (+) Teeth Intact, Dental Advisory Given   Pulmonary asthma , COPD,  COPD inhaler,    Pulmonary exam normal breath sounds clear to auscultation       Cardiovascular hypertension, Pt. on medications + CAD  Normal cardiovascular exam Rhythm:Regular Rate:Normal     Neuro/Psych PSYCHIATRIC DISORDERS Anxiety negative neurological ROS     GI/Hepatic Neg liver ROS, GERD  Medicated and Controlled,  Endo/Other  negative endocrine ROS  Renal/GU negative Renal ROS     Musculoskeletal  (+) Arthritis , Osteoarthritis,    Abdominal   Peds  Hematology negative hematology ROS (+)   Anesthesia Other Findings Day of surgery medications reviewed with the patient.  Reproductive/Obstetrics                             Anesthesia Physical  Anesthesia Plan  ASA: III  Anesthesia Plan: General   Post-op Pain Management:    Induction: Intravenous  PONV Risk Score and Plan: 2 and Ondansetron and Midazolam  Airway Management Planned: LMA  Additional Equipment:   Intra-op Plan:   Post-operative Plan: Extubation in OR  Informed Consent: I have reviewed the patients History and Physical, chart, labs and discussed the procedure including the risks, benefits and alternatives for the proposed anesthesia with the patient or authorized representative who has indicated his/her understanding and acceptance.   Dental advisory given  Plan Discussed with: CRNA  Anesthesia Plan Comments:         Anesthesia Quick Evaluation

## 2017-06-09 ENCOUNTER — Encounter (HOSPITAL_BASED_OUTPATIENT_CLINIC_OR_DEPARTMENT_OTHER): Payer: Self-pay | Admitting: Ophthalmology

## 2017-08-06 DIAGNOSIS — L309 Dermatitis, unspecified: Secondary | ICD-10-CM | POA: Diagnosis not present

## 2017-08-06 DIAGNOSIS — D485 Neoplasm of uncertain behavior of skin: Secondary | ICD-10-CM | POA: Diagnosis not present

## 2017-08-06 DIAGNOSIS — L570 Actinic keratosis: Secondary | ICD-10-CM | POA: Diagnosis not present

## 2017-08-06 DIAGNOSIS — L820 Inflamed seborrheic keratosis: Secondary | ICD-10-CM | POA: Diagnosis not present

## 2017-08-06 DIAGNOSIS — B889 Infestation, unspecified: Secondary | ICD-10-CM | POA: Diagnosis not present

## 2017-09-01 ENCOUNTER — Ambulatory Visit (INDEPENDENT_AMBULATORY_CARE_PROVIDER_SITE_OTHER): Payer: Medicare Other | Admitting: Family Medicine

## 2017-09-01 ENCOUNTER — Encounter: Payer: Self-pay | Admitting: Family Medicine

## 2017-09-01 VITALS — BP 136/88 | HR 68 | Temp 98.0°F | Resp 14 | Ht 74.5 in | Wt 188.0 lb

## 2017-09-01 DIAGNOSIS — I10 Essential (primary) hypertension: Secondary | ICD-10-CM | POA: Diagnosis not present

## 2017-09-01 MED ORDER — HYDROCHLOROTHIAZIDE 25 MG PO TABS: 25.0000 mg | ORAL_TABLET | Freq: Every day | ORAL | 3 refills | Status: DC

## 2017-09-01 NOTE — Progress Notes (Signed)
Subjective:    Patient ID: Dave Cox, male    DOB: 1941/02/12, 77 y.o.   MRN: [id number]  HPI  Recently at the dermatologist office.  There his blood pressure was 180/111.  At home he is checking his blood pressure and finding it to be 937-169 systolic over near 678 diastolic.  Here I rechecked his blood pressure and got 160/94.  He is taking losartan 100 mg a day Past Medical History:  Diagnosis Date  . Abnormal EKG   . Anxiety   . CAD (coronary artery disease)   . Cervical osteoarthritis   . COPD (chronic obstructive pulmonary disease) ([place])   . GERD (gastroesophageal reflux disease)   . HTN (hypertension)   . Hyperlipidemia   . Melanoma ([place])    eye  . Nephrolithiasis    Past Surgical History:  Procedure Laterality Date  . BASAL CELL CARCINOMA EXCISION    . CARDIAC CATHETERIZATION    . HERNIA REPAIR    . KIDNEY STONE SURGERY    . LUMBAR DISC SURGERY    . MELANOMA EXCISION     eye  . STRABISMUS SURGERY Bilateral 08/23/2016   Procedure: REPAIR STRABISMUS;  Surgeon: [name], MD;  Location: [facility];  Service: Ophthalmology;  Laterality: Bilateral;  . STRABISMUS SURGERY Left 06/06/2017   Procedure: STRABISMUS REPAIR;  Surgeon: [name], MD;  Location: [facility];  Service: Ophthalmology;  Laterality: Left;  . TONSILLECTOMY     Current Outpatient Medications on File Prior to Visit  Medication Sig Dispense Refill  . aspirin EC 81 MG tablet Take 81 mg by mouth daily.    . calcipotriene (DOVONEX) 0.005 % cream Apply 2 (two) times daily topically. 60 g 1  . celecoxib (CELEBREX) 200 MG capsule Take 1 capsule (200 mg total) by mouth daily. 90 capsule 3  . EPINEPHrine 0.3 mg/0.3 mL IJ SOAJ injection Inject 0.3 mLs (0.3 mg total) into the muscle as needed. 2 Device 3  . hydrOXYzine (ATARAX/VISTARIL) 25 MG tablet Take 1 tablet (25 mg total) by mouth 3 (three) times daily as needed. 30 tablet 0  . LORATADINE PO Take 10 mg by mouth as  needed.     Marland Kitchen losartan (COZAAR) 100 MG tablet Take 1 tablet (100 mg total) by mouth daily. (Patient taking differently: Take 50 mg by mouth daily. ) 90 tablet 3  . mometasone (ELOCON) 0.1 % ointment Apply topically daily. 45 g 3  . NEXIUM 40 MG packet Take 40 mg by mouth daily before breakfast. 90 each 3  . rosuvastatin (CRESTOR) 20 MG tablet Take 1 tablet (20 mg total) by mouth daily. 90 tablet 3  . triamcinolone cream (KENALOG) 0.1 % apply topically twice daily FOR 14 DAYS  0  . albuterol (VENTOLIN HFA) 108 (90 Base) MCG/ACT inhaler Inhale 2 puffs into the lungs 4 (four) times daily as needed for wheezing or shortness of breath. 3 Inhaler 3   No current facility-administered medications on file prior to visit.    Allergies  Allergen Reactions  . Bee Venom    Social History   Socioeconomic History  . Marital status: Married    Spouse name: Not on file  . Number of children: Not on file  . Years of education: Not on file  . Highest education level: Not on file  Occupational History  . Not on file  Social Needs  . Financial resource strain: Not on file  . Food insecurity:    Worry:  Not on file    Inability: Not on file  . Transportation needs:    Medical: Not on file    Non-medical: Not on file  Tobacco Use  . Smoking status: Never Smoker  . Smokeless tobacco: Never Used  Substance and Sexual Activity  . Alcohol use: No  . Drug use: No  . Sexual activity: Not on file  Lifestyle  . Physical activity:    Days per week: Not on file    Minutes per session: Not on file  . Stress: Not on file  Relationships  . Social connections:    Talks on phone: Not on file    Gets together: Not on file    Attends religious service: Not on file    Active member of club or organization: Not on file    Attends meetings of clubs or organizations: Not on file    Relationship status: Not on file  . Intimate partner violence:    Fear of current or ex partner: Not on file    Emotionally  abused: Not on file    Physically abused: Not on file    Forced sexual activity: Not on file  Other Topics Concern  . Not on file  Social History Narrative  . Not on file   Family History  Problem Relation Age of Onset  . Prostate cancer Father        mets  . Arthritis Father   . Hypertension Father   . Arthritis Mother   . Heart disease Mother   . Hypertension Mother   . Stroke Mother   . Lung cancer Brother        was a smoker     Review of Systems  All other systems reviewed and are negative.      Objective:   Physical Exam  Constitutional: He is oriented to person, place, and time. He appears well-developed and well-nourished. No distress.  HENT:  Head: Normocephalic and atraumatic.  Right Ear: External ear normal.  Left Ear: External ear normal.  Nose: Nose normal.  Mouth/Throat: Oropharynx is clear and moist. No oropharyngeal exudate.  Eyes: Pupils are equal, round, and reactive to light. Conjunctivae and EOM are normal. Right eye exhibits no discharge. Left eye exhibits no discharge. No scleral icterus.  Neck: Normal range of motion. Neck supple. No JVD present. No tracheal deviation present. No thyromegaly present.  Cardiovascular: Normal rate, regular rhythm, normal heart sounds and intact distal pulses. Exam reveals no gallop and no friction rub.  No murmur heard. Pulmonary/Chest: Effort normal and breath sounds normal. No stridor. No respiratory distress. He has no wheezes. He has no rales. He exhibits no tenderness.  Abdominal: Soft. Bowel sounds are normal. He exhibits no distension and no mass. There is no tenderness. There is no rebound and no guarding.  Musculoskeletal: Normal range of motion. He exhibits no edema or tenderness.  Lymphadenopathy:    He has no cervical adenopathy.  Neurological: He is alert and oriented to person, place, and time. He has normal reflexes. No cranial nerve deficit. He exhibits normal muscle tone. Coordination normal.  Skin:  Skin is warm. No rash noted. He is not diaphoretic. No erythema. No pallor.  Psychiatric: He has a normal mood and affect. His behavior is normal. Judgment and thought content normal.  Vitals reviewed.         Assessment & Plan:  Benign essential HTN Add hydrochlorothiazide 25 mg p.o. daily and recheck blood pressure in 2 to 3  weeks.

## 2017-09-10 ENCOUNTER — Other Ambulatory Visit: Payer: Self-pay | Admitting: Family Medicine

## 2017-09-10 DIAGNOSIS — L111 Transient acantholytic dermatosis [Grover]: Secondary | ICD-10-CM

## 2017-09-10 MED ORDER — HYDROXYZINE HCL 25 MG PO TABS: 25.0000 mg | ORAL_TABLET | Freq: Three times a day (TID) | ORAL | 0 refills | Status: DC | PRN

## 2017-10-02 ENCOUNTER — Ambulatory Visit: Payer: Medicare Other | Admitting: Family Medicine

## 2017-10-23 ENCOUNTER — Encounter: Payer: Self-pay | Admitting: Family Medicine

## 2017-11-04 ENCOUNTER — Ambulatory Visit: Payer: Medicare Other

## 2017-11-04 ENCOUNTER — Telehealth: Payer: Self-pay | Admitting: Family Medicine

## 2017-11-04 VITALS — BP 124/80

## 2017-11-04 DIAGNOSIS — Z0130 Encounter for examination of blood pressure without abnormal findings: Secondary | ICD-10-CM

## 2017-11-04 NOTE — Telephone Encounter (Signed)
Patient came in today for a blood pressure check. Patient states that he was to have a steroid injection with Dermatology and was unable to due to blood pressure being elevated each time. Patient blood pressure today was 124/80 at rest in left arm with normal size cuff while sitting. Patient states he needs a letter sent to his dermatologist stating that he blood pressure is safe for him to have the injection with Dr. Delman Cheadle. Please advise?

## 2017-11-04 NOTE — Telephone Encounter (Signed)
Please send note to dermatology that his blood pressure in our office was normal and he is cleared for his injection.

## 2017-11-04 NOTE — Progress Notes (Signed)
Patient came in today for a blood pressure check. Patient states that he was to have a steroid injection with Dermatology and was unable to due to blood pressure being elevated each time. Patient blood pressure today was 124/80 at rest in left arm with normal size cuff while sitting. Patient states he needs a letter sent to his dermatologist stating that he blood pressure is safe for him to have the injection with Dr. Delman Cheadle. Telephone call being sent to provider.

## 2017-11-05 ENCOUNTER — Encounter: Payer: Self-pay | Admitting: Family Medicine

## 2017-11-07 NOTE — Telephone Encounter (Signed)
Spoke with patient and informed him that a letter was sent to his dermatologist stating he may now get the injection. Patient verbalized understanding.

## 2017-11-13 DIAGNOSIS — D3610 Benign neoplasm of peripheral nerves and autonomic nervous system, unspecified: Secondary | ICD-10-CM | POA: Diagnosis not present

## 2017-11-13 DIAGNOSIS — D225 Melanocytic nevi of trunk: Secondary | ICD-10-CM | POA: Diagnosis not present

## 2017-11-13 DIAGNOSIS — L570 Actinic keratosis: Secondary | ICD-10-CM | POA: Diagnosis not present

## 2017-11-13 DIAGNOSIS — L309 Dermatitis, unspecified: Secondary | ICD-10-CM | POA: Diagnosis not present

## 2017-11-13 DIAGNOSIS — L820 Inflamed seborrheic keratosis: Secondary | ICD-10-CM | POA: Diagnosis not present

## 2017-11-13 DIAGNOSIS — Z85828 Personal history of other malignant neoplasm of skin: Secondary | ICD-10-CM | POA: Diagnosis not present

## 2017-12-12 ENCOUNTER — Other Ambulatory Visit: Payer: Self-pay | Admitting: Family Medicine

## 2017-12-12 DIAGNOSIS — I10 Essential (primary) hypertension: Secondary | ICD-10-CM

## 2017-12-12 MED ORDER — LOSARTAN POTASSIUM 100 MG PO TABS: 100.0000 mg | ORAL_TABLET | Freq: Every day | ORAL | 3 refills | Status: DC

## 2017-12-22 DIAGNOSIS — H02402 Unspecified ptosis of left eyelid: Secondary | ICD-10-CM | POA: Diagnosis not present

## 2017-12-22 DIAGNOSIS — H02831 Dermatochalasis of right upper eyelid: Secondary | ICD-10-CM | POA: Diagnosis not present

## 2017-12-22 DIAGNOSIS — Z961 Presence of intraocular lens: Secondary | ICD-10-CM | POA: Diagnosis not present

## 2018-02-17 DIAGNOSIS — L309 Dermatitis, unspecified: Secondary | ICD-10-CM | POA: Diagnosis not present

## 2018-02-17 DIAGNOSIS — Z23 Encounter for immunization: Secondary | ICD-10-CM | POA: Diagnosis not present

## 2018-02-17 DIAGNOSIS — L570 Actinic keratosis: Secondary | ICD-10-CM | POA: Diagnosis not present

## 2018-02-24 DIAGNOSIS — H5021 Vertical strabismus, right eye: Secondary | ICD-10-CM | POA: Diagnosis not present

## 2018-03-19 ENCOUNTER — Ambulatory Visit (INDEPENDENT_AMBULATORY_CARE_PROVIDER_SITE_OTHER): Payer: Medicare Other | Admitting: Family Medicine

## 2018-03-19 ENCOUNTER — Encounter: Payer: Self-pay | Admitting: Family Medicine

## 2018-03-19 VITALS — BP 124/80 | HR 54 | Temp 97.6°F | Resp 16 | Ht 74.5 in | Wt 191.0 lb

## 2018-03-19 DIAGNOSIS — Z125 Encounter for screening for malignant neoplasm of prostate: Secondary | ICD-10-CM | POA: Diagnosis not present

## 2018-03-19 DIAGNOSIS — I10 Essential (primary) hypertension: Secondary | ICD-10-CM | POA: Diagnosis not present

## 2018-03-19 DIAGNOSIS — Z0000 Encounter for general adult medical examination without abnormal findings: Secondary | ICD-10-CM

## 2018-03-19 DIAGNOSIS — E7800 Pure hypercholesterolemia, unspecified: Secondary | ICD-10-CM

## 2018-03-19 DIAGNOSIS — Z23 Encounter for immunization: Secondary | ICD-10-CM

## 2018-03-19 MED ORDER — TAMSULOSIN HCL 0.4 MG PO CAPS: 0.4000 mg | ORAL_CAPSULE | Freq: Every day | ORAL | 3 refills | Status: DC

## 2018-03-19 NOTE — Progress Notes (Signed)
Subjective:    Patient ID: Dave Cox, male    DOB: Apr 10, 1941, 77 y.o.   MRN: [id number]  HPI  Patient is here today for complete physical exam.  We had a long discussion about colon cancer screening and prostate cancer screening.  Because of his age I have recommended against both.  However the patient agrees with not going for another colonoscopy.  He would still like to check his PSA.  He reports weak urinary stream at times.  He reports nocturia.  He reports hesitancy at times.  I believe he has BPH as he has had this on exam previously in the past.  He would still feel more comfortable if we checked a PSA to monitor for prostate cancer.  His immunizations are up-to-date except for his flu shot as well as the shingles vaccine. Past Medical History:  Diagnosis Date  . Abnormal EKG   . Anxiety   . CAD (coronary artery disease)   . Cervical osteoarthritis   . COPD (chronic obstructive pulmonary disease) ([place])   . GERD (gastroesophageal reflux disease)   . HTN (hypertension)   . Hyperlipidemia   . Melanoma ([place])    eye  . Nephrolithiasis    Past Surgical History:  Procedure Laterality Date  . BASAL CELL CARCINOMA EXCISION    . CARDIAC CATHETERIZATION    . HERNIA REPAIR    . KIDNEY STONE SURGERY    . LUMBAR DISC SURGERY    . MELANOMA EXCISION     eye  . STRABISMUS SURGERY Bilateral 08/23/2016   Procedure: REPAIR STRABISMUS;  Surgeon: [name], MD;  Location: [place];  Service: Ophthalmology;  Laterality: Bilateral;  . STRABISMUS SURGERY Left 06/06/2017   Procedure: STRABISMUS REPAIR;  Surgeon: [name], MD;  Location: [place];  Service: Ophthalmology;  Laterality: Left;  . TONSILLECTOMY     Current Outpatient Medications on File Prior to Visit  Medication Sig Dispense Refill  . aspirin EC 81 MG tablet Take 81 mg by mouth daily.    . celecoxib (CELEBREX) 200 MG capsule Take 1 capsule (200 mg total) by mouth daily. 90 capsule 3    . hydrochlorothiazide (HYDRODIURIL) 25 MG tablet Take 1 tablet (25 mg total) by mouth daily. 90 tablet 3  . hydrOXYzine (ATARAX/VISTARIL) 25 MG tablet Take 1 tablet (25 mg total) by mouth 3 (three) times daily as needed. 30 tablet 0  . losartan (COZAAR) 100 MG tablet Take 1 tablet (100 mg total) by mouth daily. 90 tablet 3  . NEXIUM 40 MG packet Take 40 mg by mouth daily before breakfast. 90 each 3  . rosuvastatin (CRESTOR) 20 MG tablet Take 1 tablet (20 mg total) by mouth daily. 90 tablet 3  . triamcinolone cream (KENALOG) 0.1 % apply topically twice daily FOR 14 DAYS  0  . albuterol (VENTOLIN HFA) 108 (90 Base) MCG/ACT inhaler Inhale 2 puffs into the lungs 4 (four) times daily as needed for wheezing or shortness of breath. 3 Inhaler 3  . EPINEPHrine 0.3 mg/0.3 mL IJ SOAJ injection Inject 0.3 mLs (0.3 mg total) into the muscle as needed. (Patient not taking: Reported on 03/19/2018) 2 Device 3  . LORATADINE PO Take 10 mg by mouth as needed.     . mometasone (ELOCON) 0.1 % ointment Apply topically daily. (Patient not taking: Reported on 03/19/2018) 45 g 3   No current facility-administered medications on file prior to visit.    Allergies  Allergen Reactions  .  Bee Venom    Social History   Socioeconomic History  . Marital status: Married    Spouse name: Not on file  . Number of children: Not on file  . Years of education: Not on file  . Highest education level: Not on file  Occupational History  . Not on file  Social Needs  . Financial resource strain: Not on file  . Food insecurity:    Worry: Not on file    Inability: Not on file  . Transportation needs:    Medical: Not on file    Non-medical: Not on file  Tobacco Use  . Smoking status: Never Smoker  . Smokeless tobacco: Never Used  Substance and Sexual Activity  . Alcohol use: No  . Drug use: No  . Sexual activity: Not on file  Lifestyle  . Physical activity:    Days per week: Not on file    Minutes per session: Not on  file  . Stress: Not on file  Relationships  . Social connections:    Talks on phone: Not on file    Gets together: Not on file    Attends religious service: Not on file    Active member of club or organization: Not on file    Attends meetings of clubs or organizations: Not on file    Relationship status: Not on file  . Intimate partner violence:    Fear of current or ex partner: Not on file    Emotionally abused: Not on file    Physically abused: Not on file    Forced sexual activity: Not on file  Other Topics Concern  . Not on file  Social History Narrative  . Not on file   Family History  Problem Relation Age of Onset  . Prostate cancer Father        mets  . Arthritis Father   . Hypertension Father   . Arthritis Mother   . Heart disease Mother   . Hypertension Mother   . Stroke Mother   . Lung cancer Brother        was a smoker     Review of Systems  All other systems reviewed and are negative.      Objective:   Physical Exam  Constitutional: He is oriented to person, place, and time. He appears well-developed and well-nourished. No distress.  HENT:  Head: Normocephalic and atraumatic.  Right Ear: External ear normal.  Left Ear: External ear normal.  Nose: Nose normal.  Mouth/Throat: Oropharynx is clear and moist. No oropharyngeal exudate.  Eyes: Pupils are equal, round, and reactive to light. Conjunctivae and EOM are normal. Right eye exhibits no discharge. Left eye exhibits no discharge. No scleral icterus.  Neck: Normal range of motion. Neck supple. No JVD present. No tracheal deviation present. No thyromegaly present.  Cardiovascular: Normal rate, regular rhythm, normal heart sounds and intact distal pulses. Exam reveals no gallop and no friction rub.  No murmur heard. Pulmonary/Chest: Effort normal and breath sounds normal. No stridor. No respiratory distress. He has no wheezes. He has no rales. He exhibits no tenderness.  Abdominal: Soft. Bowel sounds are  normal. He exhibits no distension and no mass. There is no tenderness. There is no rebound and no guarding.  Musculoskeletal: Normal range of motion. He exhibits no edema or tenderness.  Lymphadenopathy:    He has no cervical adenopathy.  Neurological: He is alert and oriented to person, place, and time. He has normal reflexes. No cranial  nerve deficit. He exhibits normal muscle tone. Coordination normal.  Skin: Skin is warm. No rash noted. He is not diaphoretic. No erythema. No pallor.  Psychiatric: He has a normal mood and affect. His behavior is normal. Judgment and thought content normal.  Vitals reviewed.         Assessment & Plan:  Routine general medical examination at a health care facility  Prostate cancer screening  Pure hypercholesterolemia  Benign essential HTN Physical exam today is excellent.  There are no abnormalities found.  The patient received his flu shot today.  I recommended the shingles vaccine.  We agreed not to pursue further colon cancer screening.  Per the patient's request I will check a PSA.  However I will also start the patient on Flomax 0.4 mg p.o. nightly to treat symptoms of BPH.  We will also check a CBC, CMP, fasting lipid panel.

## 2018-03-20 LAB — LIPID PANEL
CHOL/HDL RATIO: 4 (calc) (ref <5.0)
Cholesterol: 153 mg/dL (ref <200)
HDL: 38 mg/dL — AB (ref >40)
LDL Cholesterol (Calc): 96 mg/dL (calc)
NON-HDL CHOLESTEROL (CALC): 115 mg/dL (ref <130)
Triglycerides: 95 mg/dL (ref <150)

## 2018-03-20 LAB — COMPLETE METABOLIC PANEL WITH GFR
AG Ratio: 1.9 (calc) (ref 1.0–2.5)
ALKALINE PHOSPHATASE (APISO): 60 U/L (ref 40–115)
ALT: 27 U/L (ref 9–46)
AST: 27 U/L (ref 10–35)
Albumin: 4.3 g/dL (ref 3.6–5.1)
BILIRUBIN TOTAL: 0.9 mg/dL (ref 0.2–1.2)
BUN: 19 mg/dL (ref 7–25)
CO2: 26 mmol/L (ref 20–32)
Calcium: 9.5 mg/dL (ref 8.6–10.3)
Chloride: 107 mmol/L (ref 98–110)
Creat: 1.1 mg/dL (ref 0.70–1.18)
GFR, EST AFRICAN AMERICAN: 75 mL/min/{1.73_m2} (ref >=60)
GFR, Est Non African American: 64 mL/min/{1.73_m2} (ref >=60)
Globulin: 2.3 g/dL (calc) (ref 1.9–3.7)
Glucose, Bld: 104 mg/dL — ABNORMAL HIGH (ref 65–99)
POTASSIUM: 4.4 mmol/L (ref 3.5–5.3)
Sodium: 143 mmol/L (ref 135–146)
TOTAL PROTEIN: 6.6 g/dL (ref 6.1–8.1)

## 2018-03-20 LAB — CBC WITH DIFFERENTIAL/PLATELET
BASOS PCT: 0.5 %
Basophils Absolute: 41 cells/uL (ref 0–200)
EOS ABS: 402 {cells}/uL (ref 15–500)
Eosinophils Relative: 4.9 %
HCT: 45.5 % (ref 38.5–50.0)
Hemoglobin: 16.1 g/dL (ref 13.2–17.1)
Lymphs Abs: 1673 cells/uL (ref 850–3900)
MCH: 31.8 pg (ref 27.0–33.0)
MCHC: 35.4 g/dL (ref 32.0–36.0)
MCV: 89.7 fL (ref 80.0–100.0)
MONOS PCT: 12.4 %
MPV: 12.7 fL — AB (ref 7.5–12.5)
Neutro Abs: 5068 cells/uL (ref 1500–7800)
Neutrophils Relative %: 61.8 %
PLATELETS: 131 10*3/uL — AB (ref 140–400)
RBC: 5.07 10*6/uL (ref 4.20–5.80)
RDW: 11.8 % (ref 11.0–15.0)
TOTAL LYMPHOCYTE: 20.4 %
WBC mixed population: 1017 cells/uL — ABNORMAL HIGH (ref 200–950)
WBC: 8.2 10*3/uL (ref 3.8–10.8)

## 2018-03-20 LAB — PSA: PSA: 1.3 ng/mL (ref <=4.0)

## 2018-03-21 ENCOUNTER — Encounter: Payer: Self-pay | Admitting: *Deleted

## 2018-03-23 ENCOUNTER — Other Ambulatory Visit: Payer: Self-pay

## 2018-04-01 ENCOUNTER — Telehealth: Payer: Self-pay | Admitting: Family Medicine

## 2018-04-01 NOTE — Telephone Encounter (Signed)
Needs refill on his Celebrex through patient assistance through [place]- [phone] with pt ID # [id number]  Medication ordered and will arrive in 7 - 10 business days. Order ID# [id number]

## 2018-05-28 ENCOUNTER — Encounter: Payer: Self-pay | Admitting: Family Medicine

## 2018-05-28 DIAGNOSIS — L570 Actinic keratosis: Secondary | ICD-10-CM | POA: Diagnosis not present

## 2018-05-28 DIAGNOSIS — D485 Neoplasm of uncertain behavior of skin: Secondary | ICD-10-CM | POA: Diagnosis not present

## 2018-05-28 DIAGNOSIS — Z23 Encounter for immunization: Secondary | ICD-10-CM | POA: Diagnosis not present

## 2018-05-28 DIAGNOSIS — L820 Inflamed seborrheic keratosis: Secondary | ICD-10-CM | POA: Diagnosis not present

## 2018-06-16 ENCOUNTER — Other Ambulatory Visit: Payer: Self-pay | Admitting: Family Medicine

## 2018-06-16 DIAGNOSIS — I10 Essential (primary) hypertension: Secondary | ICD-10-CM

## 2018-07-27 ENCOUNTER — Other Ambulatory Visit: Payer: Self-pay

## 2018-07-27 ENCOUNTER — Ambulatory Visit (INDEPENDENT_AMBULATORY_CARE_PROVIDER_SITE_OTHER): Payer: Medicare Other | Admitting: Family Medicine

## 2018-07-27 DIAGNOSIS — B9689 Other specified bacterial agents as the cause of diseases classified elsewhere: Secondary | ICD-10-CM

## 2018-07-27 DIAGNOSIS — J019 Acute sinusitis, unspecified: Secondary | ICD-10-CM

## 2018-07-27 MED ORDER — AMOXICILLIN 875 MG PO TABS: 875.0000 mg | ORAL_TABLET | Freq: Two times a day (BID) | ORAL | 0 refills | Status: DC

## 2018-07-27 NOTE — Progress Notes (Addendum)
Subjective:    Patient ID: Dave Cox, male    DOB: October 30, 1940, 78 y.o.   MRN: [id number]  HPI Visit was conducted today as part of a telephone encounter.  Patient is at home with his wife.  I am in my office.  Patient consented to having a telephone visit.  Patient reports a 7 to 8-day history of head congestion.  Started as a cold.  Symptoms include rhinorrhea, chest congestion, cough.  However now greater than 1 week into symptoms, he has developed pain and pressure in his frontal sinus area.  He reports thick green-yellow sinus drainage with significant pressure in his frontal sinuses.  He is having frequent headaches.  He denies any fever.  He denies any cough.  He denies any chest pain.  He denies any shortness of breath.  He denies any travel.  He is tried Mucinex DM, he is taking Nasonex on a daily basis, he is taking Oxley metolazone for the last 3 days.  Despite using these medications, symptoms are worsening.  He is also on an allergy medicine he believes is Claritin.  He is unable to take Sudafed due to his hypertension. Past Medical History:  Diagnosis Date  . Abnormal EKG   . Anxiety   . CAD (coronary artery disease)   . Cervical osteoarthritis   . COPD (chronic obstructive pulmonary disease) ([place])   . GERD (gastroesophageal reflux disease)   . HTN (hypertension)   . Hyperlipidemia   . Melanoma ([place])    eye  . Nephrolithiasis    Past Surgical History:  Procedure Laterality Date  . BASAL CELL CARCINOMA EXCISION    . CARDIAC CATHETERIZATION    . HERNIA REPAIR    . KIDNEY STONE SURGERY    . LUMBAR DISC SURGERY    . MELANOMA EXCISION     eye  . STRABISMUS SURGERY Bilateral 08/23/2016   Procedure: REPAIR STRABISMUS;  Surgeon: [name], MD;  Location: [place];  Service: Ophthalmology;  Laterality: Bilateral;  . STRABISMUS SURGERY Left 06/06/2017   Procedure: STRABISMUS REPAIR;  Surgeon: [name], MD;  Location: [place];   Service: Ophthalmology;  Laterality: Left;  . TONSILLECTOMY     Current Outpatient Medications on File Prior to Visit  Medication Sig Dispense Refill  . albuterol (VENTOLIN HFA) 108 (90 Base) MCG/ACT inhaler Inhale 2 puffs into the lungs 4 (four) times daily as needed for wheezing or shortness of breath. 3 Inhaler 3  . aspirin EC 81 MG tablet Take 81 mg by mouth daily.    . celecoxib (CELEBREX) 200 MG capsule Take 1 capsule (200 mg total) by mouth daily. 90 capsule 3  . EPINEPHrine 0.3 mg/0.3 mL IJ SOAJ injection Inject 0.3 mLs (0.3 mg total) into the muscle as needed. (Patient not taking: Reported on 03/19/2018) 2 Device 3  . hydrochlorothiazide (HYDRODIURIL) 25 MG tablet Take 1 tablet (25 mg total) by mouth daily. 90 tablet 3  . hydrOXYzine (ATARAX/VISTARIL) 25 MG tablet Take 1 tablet (25 mg total) by mouth 3 (three) times daily as needed. 30 tablet 0  . LORATADINE PO Take 10 mg by mouth as needed.     Marland Kitchen losartan (COZAAR) 100 MG tablet TAKE ONE TABLET BY MOUTH DAILY 90 tablet 0  . mometasone (ELOCON) 0.1 % ointment Apply topically daily. (Patient not taking: Reported on 03/19/2018) 45 g 3  . NEXIUM 40 MG packet Take 40 mg by mouth daily before breakfast. 90 each 3  .  rosuvastatin (CRESTOR) 20 MG tablet Take 1 tablet (20 mg total) by mouth daily. 90 tablet 3  . tamsulosin (FLOMAX) 0.4 MG CAPS capsule Take 1 capsule (0.4 mg total) by mouth daily. 30 capsule 3  . triamcinolone cream (KENALOG) 0.1 % apply topically twice daily FOR 14 DAYS  0   No current facility-administered medications on file prior to visit.    Allergies  Allergen Reactions  . Bee Venom    Social History   Socioeconomic History  . Marital status: Married    Spouse name: Not on file  . Number of children: Not on file  . Years of education: Not on file  . Highest education level: Not on file  Occupational History  . Not on file  Social Needs  . Financial resource strain: Not on file  . Food insecurity:    Worry:  Not on file    Inability: Not on file  . Transportation needs:    Medical: Not on file    Non-medical: Not on file  Tobacco Use  . Smoking status: Never Smoker  . Smokeless tobacco: Never Used  Substance and Sexual Activity  . Alcohol use: No  . Drug use: No  . Sexual activity: Not on file  Lifestyle  . Physical activity:    Days per week: Not on file    Minutes per session: Not on file  . Stress: Not on file  Relationships  . Social connections:    Talks on phone: Not on file    Gets together: Not on file    Attends religious service: Not on file    Active member of club or organization: Not on file    Attends meetings of clubs or organizations: Not on file    Relationship status: Not on file  . Intimate partner violence:    Fear of current or ex partner: Not on file    Emotionally abused: Not on file    Physically abused: Not on file    Forced sexual activity: Not on file  Other Topics Concern  . Not on file  Social History Narrative  . Not on file      Review of Systems  All other systems reviewed and are negative.      Objective:   Physical Exam  No physical exam was performed as this was a telephone visit      Assessment & Plan:  Acute bacterial rhinosinusitis  Patient sounds to have developed a sinus infection after recent upper respiratory illness.  Symptoms are worsening now greater than 1 week after symptom development.  He is now having pain and pressure in his frontal sinuses, purulent sinus drainage, and a headache.  I will treat the patient with amoxicillin 875 mg p.o. twice daily for 10 days.  He can continue to use Mucinex, Nasonex, and his allergy medicine.  Phone call began at 1145 and concluded our visit at 1157.

## 2018-07-31 ENCOUNTER — Encounter: Payer: Self-pay | Admitting: Family Medicine

## 2018-07-31 ENCOUNTER — Telehealth: Payer: Self-pay | Admitting: Family Medicine

## 2018-07-31 MED ORDER — CELECOXIB 200 MG PO CAPS: 200.0000 mg | ORAL_CAPSULE | Freq: Every day | ORAL | 3 refills | Status: DC

## 2018-07-31 NOTE — Telephone Encounter (Signed)
Patient called and requested that his Celebrex be sent to his home d/t covid-19. Called [facility] spoke to [facility] (?) and was informed that we needed to send RX and letter to them in order for them to ship medication to pt house. Order and letter faxed to [phone].

## 2018-08-13 ENCOUNTER — Ambulatory Visit (INDEPENDENT_AMBULATORY_CARE_PROVIDER_SITE_OTHER): Payer: Medicare Other | Admitting: Family Medicine

## 2018-08-13 ENCOUNTER — Other Ambulatory Visit: Payer: Self-pay

## 2018-08-13 DIAGNOSIS — J301 Allergic rhinitis due to pollen: Secondary | ICD-10-CM

## 2018-08-13 MED ORDER — PREDNISONE 20 MG PO TABS: ORAL_TABLET | ORAL | 0 refills | Status: DC

## 2018-08-13 MED ORDER — CEFDINIR 300 MG PO CAPS: 300.0000 mg | ORAL_CAPSULE | Freq: Two times a day (BID) | ORAL | 0 refills | Status: DC

## 2018-08-13 NOTE — Progress Notes (Signed)
Subjective:    Patient ID: Dave Cox, male    DOB: Sep 22, 1940, 78 y.o.   MRN: [id number]  HPI  07/27/18 Visit was conducted today as part of a telephone encounter.  Patient is at home with his wife.  I am in my office.  Patient consented to having a telephone visit.  Patient reports a 7 to 8-day history of head congestion.  Started as a cold.  Symptoms include rhinorrhea, chest congestion, cough.  However now greater than 1 week into symptoms, he has developed pain and pressure in his frontal sinus area.  He reports thick green-yellow sinus drainage with significant pressure in his frontal sinuses.  He is having frequent headaches.  He denies any fever.  He denies any cough.  He denies any chest pain.  He denies any shortness of breath.  He denies any travel.  He is tried Mucinex DM, he is taking Nasonex on a daily basis, he is taking Oxley metolazone for the last 3 days.  Despite using these medications, symptoms are worsening.  He is also on an allergy medicine he believes is Claritin.  He is unable to take Sudafed due to his hypertension.  At that time, my plan was: Patient sounds to have developed a sinus infection after recent upper respiratory illness.  Symptoms are worsening now greater than 1 week after symptom development.  He is now having pain and pressure in his frontal sinuses, purulent sinus drainage, and a headache.  I will treat the patient with amoxicillin 875 mg p.o. twice daily for 10 days.  He can continue to use Mucinex, Nasonex, and his allergy medicine.  Phone call began at 1145 and concluded our visit at 1157.  08/13/18 Patient is being seen again today as part of a follow-up from his previous office visit.  He is being seen over the telephone.  He is at home with his wife.  I am in my office.  He consents to have a telephone visit.  Phone call began at 12:00.  Call concluded at 1211.  Patient states his symptoms completely improved after taking the amoxicillin.  He only had  some residual trace rhinorrhea.  However 1 week after discontinuing the amoxicillin, he now has sinus congestion, postnasal drip, rhinorrhea.  He is blowing yellow thick mucus from his nose every night.  He is occasionally coughing up yellow mucus.  He denies any sinus pain.  He denies any headache.  He denies any fevers or chills or shortness of breath.  He is still taking hydroxyzine 3 times a day.  He is also taking Nasacort every day.  Despite that he continues to have rhinorrhea and sinus congestion Past Medical History:  Diagnosis Date  . Abnormal EKG   . Anxiety   . CAD (coronary artery disease)   . Cervical osteoarthritis   . COPD (chronic obstructive pulmonary disease) ([place])   . GERD (gastroesophageal reflux disease)   . HTN (hypertension)   . Hyperlipidemia   . Melanoma ([place])    eye  . Nephrolithiasis    Past Surgical History:  Procedure Laterality Date  . BASAL CELL CARCINOMA EXCISION    . CARDIAC CATHETERIZATION    . HERNIA REPAIR    . KIDNEY STONE SURGERY    . LUMBAR DISC SURGERY    . MELANOMA EXCISION     eye  . STRABISMUS SURGERY Bilateral 08/23/2016   Procedure: REPAIR STRABISMUS;  Surgeon: [name], MD;  Location: [place];  Service:  Ophthalmology;  Laterality: Bilateral;  . STRABISMUS SURGERY Left 06/06/2017   Procedure: STRABISMUS REPAIR;  Surgeon: [name], MD;  Location: [place];  Service: Ophthalmology;  Laterality: Left;  . TONSILLECTOMY     Current Outpatient Medications on File Prior to Visit  Medication Sig Dispense Refill  . albuterol (VENTOLIN HFA) 108 (90 Base) MCG/ACT inhaler Inhale 2 puffs into the lungs 4 (four) times daily as needed for wheezing or shortness of breath. 3 Inhaler 3  . amoxicillin (AMOXIL) 875 MG tablet Take 1 tablet (875 mg total) by mouth 2 (two) times daily. 20 tablet 0  . aspirin EC 81 MG tablet Take 81 mg by mouth daily.    . celecoxib (CELEBREX) 200 MG capsule Take 1 capsule (200 mg  total) by mouth daily. 90 capsule 3  . EPINEPHrine 0.3 mg/0.3 mL IJ SOAJ injection Inject 0.3 mLs (0.3 mg total) into the muscle as needed. (Patient not taking: Reported on 03/19/2018) 2 Device 3  . hydrochlorothiazide (HYDRODIURIL) 25 MG tablet Take 1 tablet (25 mg total) by mouth daily. 90 tablet 3  . hydrOXYzine (ATARAX/VISTARIL) 25 MG tablet Take 1 tablet (25 mg total) by mouth 3 (three) times daily as needed. 30 tablet 0  . LORATADINE PO Take 10 mg by mouth as needed.     Marland Kitchen losartan (COZAAR) 100 MG tablet TAKE ONE TABLET BY MOUTH DAILY 90 tablet 0  . mometasone (ELOCON) 0.1 % ointment Apply topically daily. (Patient not taking: Reported on 03/19/2018) 45 g 3  . NEXIUM 40 MG packet Take 40 mg by mouth daily before breakfast. 90 each 3  . rosuvastatin (CRESTOR) 20 MG tablet Take 1 tablet (20 mg total) by mouth daily. 90 tablet 3  . tamsulosin (FLOMAX) 0.4 MG CAPS capsule Take 1 capsule (0.4 mg total) by mouth daily. 30 capsule 3  . triamcinolone cream (KENALOG) 0.1 % apply topically twice daily FOR 14 DAYS  0   No current facility-administered medications on file prior to visit.    Allergies  Allergen Reactions  . Bee Venom    Social History   Socioeconomic History  . Marital status: Married    Spouse name: Not on file  . Number of children: Not on file  . Years of education: Not on file  . Highest education level: Not on file  Occupational History  . Not on file  Social Needs  . Financial resource strain: Not on file  . Food insecurity:    Worry: Not on file    Inability: Not on file  . Transportation needs:    Medical: Not on file    Non-medical: Not on file  Tobacco Use  . Smoking status: Never Smoker  . Smokeless tobacco: Never Used  Substance and Sexual Activity  . Alcohol use: No  . Drug use: No  . Sexual activity: Not on file  Lifestyle  . Physical activity:    Days per week: Not on file    Minutes per session: Not on file  . Stress: Not on file   Relationships  . Social connections:    Talks on phone: Not on file    Gets together: Not on file    Attends religious service: Not on file    Active member of club or organization: Not on file    Attends meetings of clubs or organizations: Not on file    Relationship status: Not on file  . Intimate partner violence:    Fear of current or  ex partner: Not on file    Emotionally abused: Not on file    Physically abused: Not on file    Forced sexual activity: Not on file  Other Topics Concern  . Not on file  Social History Narrative  . Not on file      Review of Systems  All other systems reviewed and are negative.      Objective:   Physical Exam  No physical exam was performed as this was a telephone visit      Assessment & Plan:  Seasonal allergic rhinitis due to pollen  Patient is concerned that he has a sinus infection that has been inadequately treated however I believe this is more likely sinusitis due to allergies.  Therefore I recommended trying prednisone.  I will give him a prednisone taper pack for 6 days.  If his symptoms do not improve or specifically if they worsen he can then add Omnicef 300 mg p.o. twice daily for 10 days for a sinus infection however I feel that this is most likely allergies.  Continue the Nasacort.  Continue hydroxyzine but supplement with the prednisone.  Reassess next week if no better.

## 2018-09-14 ENCOUNTER — Other Ambulatory Visit: Payer: Self-pay | Admitting: Family Medicine

## 2018-09-14 DIAGNOSIS — I10 Essential (primary) hypertension: Secondary | ICD-10-CM

## 2018-09-24 ENCOUNTER — Ambulatory Visit (INDEPENDENT_AMBULATORY_CARE_PROVIDER_SITE_OTHER): Payer: Medicare Other | Admitting: Family Medicine

## 2018-09-24 ENCOUNTER — Other Ambulatory Visit: Payer: Self-pay

## 2018-09-24 DIAGNOSIS — R55 Syncope and collapse: Secondary | ICD-10-CM

## 2018-09-24 DIAGNOSIS — N401 Enlarged prostate with lower urinary tract symptoms: Secondary | ICD-10-CM

## 2018-09-24 DIAGNOSIS — R42 Dizziness and giddiness: Secondary | ICD-10-CM | POA: Diagnosis not present

## 2018-09-24 NOTE — Progress Notes (Signed)
Subjective:    Patient ID: Dave Cox, male    DOB: 11-Dec-1940, 78 y.o.   MRN: [id number]  HPI  Patient is being seen today by telephone.  He is currently at home.  I am currently in my office.  He consents to be seen by telephone.  Phone call began at 1145.  Phone call ended at 207.  Patient states that 2 days ago, he had a sudden onset of dizziness while walking down his hallway.  He reports ataxia.  He states that he was staggering to the side into the walls of his hallway and was having a difficult time maintaining his balance as he walked.  He felt like his eyes were twitching side to side and the room was moving.  When he sat in his chair, for approximately 1 minute, he felt like the room was spinning and his eyes were twitching side to side.  As he sat there, he began to panic concerned that something life-threatening was happening.  He begin to feel extremely nauseated.  He began to feel really lightheaded.  He almost passed out however he laid down and after laying down for a few minutes, the symptoms subsided.  He never lost consciousness.  He denied any chest pain, shortness of breath, palpitations.  After lying down, the symptoms have completely subsided and have not returned again.  Patient is adamant that the symptoms would occur whenever he would turn his head to the side.  The room would spin and he would feel like the room was spinning.  He began to check his blood pressure yesterday after the event.  At that time he was very nervous and scared and his blood pressure was running between 131 and 164/88-101.  This is much higher than its been running for him.  Today his blood pressure has been 131-137/88.  He is only taking losartan.  He is no longer taking hydrochlorothiazide.  He also continues to report symptoms of BPH.  He states that when he wakes up in the morning, he has a weak stream that takes a long time to begin.  He also reports a dripping and dribbling stream and he does not  feel like he is completely emptying his bladder.  He denies any strokelike symptoms.  He denies any unilateral weakness on one side.  He denies any slurred speech.  He denies any facial droop.  He denies any fevers or chills or headache or neck stiffness. Past Medical History:  Diagnosis Date   Abnormal EKG    Anxiety    CAD (coronary artery disease)    Cervical osteoarthritis    COPD (chronic obstructive pulmonary disease) (HCC)    GERD (gastroesophageal reflux disease)    HTN (hypertension)    Hyperlipidemia    Melanoma ([place])    eye   Nephrolithiasis    Past Surgical History:  Procedure Laterality Date   BASAL CELL CARCINOMA EXCISION     CARDIAC CATHETERIZATION     HERNIA REPAIR     KIDNEY STONE SURGERY     LUMBAR [place] SURGERY     MELANOMA EXCISION     eye   STRABISMUS SURGERY Bilateral 08/23/2016   Procedure: REPAIR STRABISMUS;  Surgeon: [name], MD;  Location: [facility];  Service: Ophthalmology;  Laterality: Bilateral;   STRABISMUS SURGERY Left 06/06/2017   Procedure: STRABISMUS REPAIR;  Surgeon: [name], MD;  Location: [facility];  Service: Ophthalmology;  Laterality: Left;   TONSILLECTOMY  Current Outpatient Medications on File Prior to Visit  Medication Sig Dispense Refill   albuterol (VENTOLIN HFA) 108 (90 Base) MCG/ACT inhaler Inhale 2 puffs into the lungs 4 (four) times daily as needed for wheezing or shortness of breath. 3 Inhaler 3   aspirin EC 81 MG tablet Take 81 mg by mouth daily.     celecoxib (CELEBREX) 200 MG capsule Take 1 capsule (200 mg total) by mouth daily. 90 capsule 3   EPINEPHrine 0.3 mg/0.3 mL IJ SOAJ injection Inject 0.3 mLs (0.3 mg total) into the muscle as needed. (Patient not taking: Reported on 03/19/2018) 2 Device 3   hydrOXYzine (ATARAX/VISTARIL) 25 MG tablet Take 1 tablet (25 mg total) by mouth 3 (three) times daily as needed. 30 tablet 0   LORATADINE PO Take 10 mg by mouth as  needed.      losartan (COZAAR) 100 MG tablet TAKE ONE TABLET BY MOUTH DAILY 90 tablet 0   mometasone (ELOCON) 0.1 % ointment Apply topically daily. (Patient not taking: Reported on 03/19/2018) 45 g 3   NEXIUM 40 MG packet Take 40 mg by mouth daily before breakfast. 90 each 3   rosuvastatin (CRESTOR) 20 MG tablet Take 1 tablet (20 mg total) by mouth daily. 90 tablet 3   triamcinolone cream (KENALOG) 0.1 % apply topically twice daily FOR 14 DAYS  0   No current facility-administered medications on file prior to visit.    Allergies  Allergen Reactions   Bee Venom    Social History   Socioeconomic History   Marital status: Married    Spouse name: Not on file   Number of children: Not on file   Years of education: Not on file   Highest education level: Not on file  Occupational History   Not on file  Social Needs   Financial resource strain: Not on file   Food insecurity:    Worry: Not on file    Inability: Not on file   Transportation needs:    Medical: Not on file    Non-medical: Not on file  Tobacco Use   Smoking status: Never Smoker   Smokeless tobacco: Never Used  Substance and Sexual Activity   Alcohol use: No   Drug use: No   Sexual activity: Not on file  Lifestyle   Physical activity:    Days per week: Not on file    Minutes per session: Not on file   Stress: Not on file  Relationships   Social connections:    Talks on phone: Not on file    Gets together: Not on file    Attends religious service: Not on file    Active member of club or organization: Not on file    Attends meetings of clubs or organizations: Not on file    Relationship status: Not on file   Intimate partner violence:    Fear of current or ex partner: Not on file    Emotionally abused: Not on file    Physically abused: Not on file    Forced sexual activity: Not on file  Other Topics Concern   Not on file  Social History Narrative   Not on file     Review of  Systems  All other systems reviewed and are negative.      Objective:   Physical Exam  Physical exam could not be performed today as the patient was seen as a telephone visit      Assessment & Plan:  Vertigo  Vasovagal near syncope  Benign localized prostatic hyperplasia with lower urinary tract symptoms (LUTS)  As I explained to the patient, it is difficult to ascertain exactly what is going on over the telephone however what seems clear that the patient was experiencing vertigo when he would turn his head.  The room was spinning and he was having a difficult time maintaining his balance as he walked.  This lasted several minutes and only improved after he sat still for quite some time.  However I believe this triggered a panic attack and he had a vasovagal reaction where he became very nauseated and lightheaded.  This subsided when he lay down and his blood pressure improved.  I believe his blood pressure was elevated later that afternoon due to anxiety and fear of what occurred.  Patient has been completely asymptomatic ever since.  I do not believe the symptoms are consistent with a TIA.  Therefore I have recommended that we monitor the patient clinically over the next 24 to 48 hours.  I cautioned the patient that he may again experience vertigo with head turning and if he experiences the symptoms only when he turns his head or changes position it is most likely benign paroxysmal positional vertigo and I explained that this is a benign condition and should resolve spontaneously on its own over the next 1 to 2 weeks.  If he continues to experience lightheadedness like he may pass out, I would recommend a cardiac event monitor to rule out arrhythmias causing near syncope.  I recommended deferring treatment of his lower urinary tract symptoms at the present time as Flomax may give him dizziness and confuse the situation however once his symptoms have subsided permanently, I would recommend  resuming Flomax 0.4 mg p.o. nightly for BPH with lower urinary tract symptoms.  Patient is comfortable with this plan.  He will seek medical attention immediately if symptoms change in any way

## 2018-12-03 ENCOUNTER — Other Ambulatory Visit: Payer: Self-pay | Admitting: Family Medicine

## 2018-12-03 MED ORDER — CELECOXIB 200 MG PO CAPS: 200.0000 mg | ORAL_CAPSULE | Freq: Every day | ORAL | 3 refills | Status: DC

## 2018-12-04 ENCOUNTER — Telehealth: Payer: Self-pay

## 2018-12-04 NOTE — Telephone Encounter (Signed)
Error

## 2018-12-15 ENCOUNTER — Other Ambulatory Visit: Payer: Self-pay | Admitting: Family Medicine

## 2018-12-15 DIAGNOSIS — I10 Essential (primary) hypertension: Secondary | ICD-10-CM

## 2019-01-14 ENCOUNTER — Telehealth: Payer: Self-pay | Admitting: Internal Medicine

## 2019-01-14 NOTE — Telephone Encounter (Signed)
Pt requesting these forms be sent to him via mail. Address in Epic confirmed by pt.

## 2019-01-14 NOTE — Telephone Encounter (Signed)
I have forms ready to mail. I called pt to let him know that the forms would be mailed. Pt understood and nothing further is needed.

## 2019-01-28 DIAGNOSIS — M65322 Trigger finger, left index finger: Secondary | ICD-10-CM | POA: Diagnosis not present

## 2019-01-28 DIAGNOSIS — M79642 Pain in left hand: Secondary | ICD-10-CM | POA: Diagnosis not present

## 2019-01-28 DIAGNOSIS — M65332 Trigger finger, left middle finger: Secondary | ICD-10-CM | POA: Diagnosis not present

## 2019-02-03 DIAGNOSIS — M65322 Trigger finger, left index finger: Secondary | ICD-10-CM | POA: Diagnosis not present

## 2019-02-03 DIAGNOSIS — M65332 Trigger finger, left middle finger: Secondary | ICD-10-CM | POA: Diagnosis not present

## 2019-02-08 ENCOUNTER — Ambulatory Visit (INDEPENDENT_AMBULATORY_CARE_PROVIDER_SITE_OTHER): Payer: Medicare Other

## 2019-02-08 ENCOUNTER — Other Ambulatory Visit: Payer: Self-pay

## 2019-02-08 ENCOUNTER — Telehealth: Payer: Self-pay | Admitting: Family Medicine

## 2019-02-08 DIAGNOSIS — Z23 Encounter for immunization: Secondary | ICD-10-CM | POA: Diagnosis not present

## 2019-02-08 NOTE — Telephone Encounter (Signed)
Needs refill on his Celebrex through patient assistance through [place]- [phone] with pt ID # [id number]  Called for reorder:   Release date is 02/14/19 - Order # [id number] and it takes 3-5 business days from the release date. Next reorder date 04/24/19.

## 2019-02-17 ENCOUNTER — Telehealth: Payer: Self-pay | Admitting: Internal Medicine

## 2019-02-17 NOTE — Telephone Encounter (Signed)
Called and spoke w/ pt spouse Dave Cox regarding him and his spouse Dave Cox's pt assistance forms. I let him know we faxed the forms to the patient assistance programs 1 week ago; therefore, it will likely be some time before they hear anything from [place]. I let him know to give our office a call back in a week or so if they still have not heard from the patient assistance program. Pt verbalized understanding with no additional questions. Nothing further needed at this time.

## 2019-02-26 ENCOUNTER — Telehealth: Payer: Self-pay | Admitting: Internal Medicine

## 2019-02-26 NOTE — Telephone Encounter (Signed)
ATC pt, line went to voicemail. LMTCB x1.  Pt's [place] assistance forms were sent 02/10/2019 at 2:28 PM EDT [phone] with confirmation of success.

## 2019-02-26 NOTE — Telephone Encounter (Signed)
Pt called back stating that form cannot be faxed it has to be mailed at the address on the corner of the form, top left.  Regarding Nasonex.  Please advise.  [phone].   [place]  [address].

## 2019-02-26 NOTE — Telephone Encounter (Signed)
Called and spoke w/ pt. Pt states he spoke with the assistance program and was informed that the only way the forms can be addressed is via mail. I apologized for the delay and let him know we would mail these using the address listed in the top left corner of the forms. Pt expressed understanding with no additional questions. Forms have been sealed in an envelope and placed to outgoing mail. Nothing further needed at this time.

## 2019-02-26 NOTE — Telephone Encounter (Signed)
Pt is calling back - he is stating that [employer] is still saying that they have not received the papers.Marland KitchenMarland Kitchen

## 2019-03-08 ENCOUNTER — Telehealth: Payer: Self-pay | Admitting: Internal Medicine

## 2019-03-08 NOTE — Telephone Encounter (Signed)
Left message for patient to call back. Will need to see if patient has a copy of the forms.

## 2019-03-11 ENCOUNTER — Other Ambulatory Visit: Payer: Self-pay | Admitting: Family Medicine

## 2019-03-11 DIAGNOSIS — I10 Essential (primary) hypertension: Secondary | ICD-10-CM

## 2019-03-11 NOTE — Telephone Encounter (Addendum)
lmtcb for pt. Will need to inform pt that mailed in pt assistance forms can take weeks to be received and then processed. Phone note from 02/26/2019 shows they were placed in the mail on that date.

## 2019-03-12 NOTE — Telephone Encounter (Signed)
LMTCB x3 for pt. We have attempted to contact pt several times with no success or call back from pt. Per triage protocol, message will be closed.   

## 2019-03-19 ENCOUNTER — Other Ambulatory Visit: Payer: Self-pay

## 2019-03-22 ENCOUNTER — Encounter: Payer: Self-pay | Admitting: Family Medicine

## 2019-03-22 ENCOUNTER — Ambulatory Visit (INDEPENDENT_AMBULATORY_CARE_PROVIDER_SITE_OTHER): Payer: Medicare Other | Admitting: Family Medicine

## 2019-03-22 ENCOUNTER — Other Ambulatory Visit: Payer: Self-pay

## 2019-03-22 VITALS — BP 146/92 | HR 66 | Temp 97.4°F | Resp 14 | Ht 74.5 in | Wt 188.0 lb

## 2019-03-22 DIAGNOSIS — I10 Essential (primary) hypertension: Secondary | ICD-10-CM

## 2019-03-22 DIAGNOSIS — M25542 Pain in joints of left hand: Secondary | ICD-10-CM | POA: Diagnosis not present

## 2019-03-22 DIAGNOSIS — Z13220 Encounter for screening for lipoid disorders: Secondary | ICD-10-CM

## 2019-03-22 DIAGNOSIS — M65332 Trigger finger, left middle finger: Secondary | ICD-10-CM | POA: Diagnosis not present

## 2019-03-22 DIAGNOSIS — Z0000 Encounter for general adult medical examination without abnormal findings: Secondary | ICD-10-CM | POA: Diagnosis not present

## 2019-03-22 DIAGNOSIS — Z125 Encounter for screening for malignant neoplasm of prostate: Secondary | ICD-10-CM

## 2019-03-22 DIAGNOSIS — M6281 Muscle weakness (generalized): Secondary | ICD-10-CM | POA: Diagnosis not present

## 2019-03-22 DIAGNOSIS — M65322 Trigger finger, left index finger: Secondary | ICD-10-CM | POA: Diagnosis not present

## 2019-03-22 DIAGNOSIS — M25642 Stiffness of left hand, not elsewhere classified: Secondary | ICD-10-CM | POA: Diagnosis not present

## 2019-03-22 NOTE — Progress Notes (Signed)
Subjective:    Patient ID: Dave Cox, male    DOB: [birth year], 78 y.o.   MRN: [id number]  HPI  Patient is here today for complete physical exam.  Last year, we had a discussion about colon cancer screening and prostate cancer screening.  Patient is the primary caregiver for his wife who has early stage Parkinson's disease.  Therefore he would like to be screened for colon cancer.  He is interested in Cologuard as his wife has had this test performed.  It is been more than 5 years since his last colonoscopy he believes.  He is also due for prostate cancer screening.  We again discussed this but due to the fact his father died from metastatic prostate cancer he would like to check a PSA.  Otherwise he has been doing well.  His blood pressure here today is mildly elevated 146/92 however at home he states that his blood pressure is better controlled and is less than 140/90.  His immunizations are up-to-date as shown below: Immunization History  Administered Date(s) Administered  . Fluad Quad(high Dose 65+) 02/08/2019  . Influenza Split 03/04/2011  . Influenza, High Dose Seasonal PF 02/17/2017, 03/19/2018  . Influenza,inj,Quad PF,6+ Mos 03/08/2013, 03/13/2015, 04/11/2016  . Influenza-Unspecified 02/04/2003, 02/24/2009, 02/28/2010, 03/04/2011, 03/05/2012, 03/09/2012, 02/23/2014  . Pneumococcal Conjugate-13 03/10/2014  . Pneumococcal Polysaccharide-23 03/04/2011  . Td 12/29/2007   He denies any falls, depression, or memory loss.  Patient is still extremely sharp mentally.  Unfortunately his wife is showing early stage dementia. Past Medical History:  Diagnosis Date  . Abnormal EKG   . Anxiety   . CAD (coronary artery disease)   . Cervical osteoarthritis   . COPD (chronic obstructive pulmonary disease) ([place])   . GERD (gastroesophageal reflux disease)   . HTN (hypertension)   . Hyperlipidemia   . Melanoma ([place])    eye  . Nephrolithiasis    Past Surgical History:  Procedure Laterality  Date  . BASAL CELL CARCINOMA EXCISION    . CARDIAC CATHETERIZATION    . HERNIA REPAIR    . KIDNEY STONE SURGERY    . LUMBAR DISC SURGERY    . MELANOMA EXCISION     eye  . STRABISMUS SURGERY Bilateral 08/23/2016   Procedure: REPAIR STRABISMUS;  Surgeon: [name], MD;  Location: [facility];  Service: Ophthalmology;  Laterality: Bilateral;  . STRABISMUS SURGERY Left 06/06/2017   Procedure: STRABISMUS REPAIR;  Surgeon: [name], MD;  Location: [facility];  Service: Ophthalmology;  Laterality: Left;  . TONSILLECTOMY     Current Outpatient Medications on File Prior to Visit  Medication Sig Dispense Refill  . aspirin EC 81 MG tablet Take 81 mg by mouth daily.    . celecoxib (CELEBREX) 200 MG capsule Take 1 capsule (200 mg total) by mouth daily. 90 capsule 3  . EPINEPHrine 0.3 mg/0.3 mL IJ SOAJ injection Inject 0.3 mLs (0.3 mg total) into the muscle as needed. 2 Device 3  . hydrOXYzine (ATARAX/VISTARIL) 25 MG tablet Take 1 tablet (25 mg total) by mouth 3 (three) times daily as needed. 30 tablet 0  . LORATADINE PO Take 10 mg by mouth as needed.     Marland Kitchen losartan (COZAAR) 100 MG tablet TAKE ONE TABLET BY MOUTH DAILY 90 tablet 0  . NEXIUM 40 MG packet Take 40 mg by mouth daily before breakfast. 90 each 3  . rosuvastatin (CRESTOR) 20 MG tablet Take 1 tablet (20 mg total) by mouth daily. [place]  tablet 3  . triamcinolone cream (KENALOG) 0.1 % apply topically twice daily FOR 14 DAYS  0  . albuterol (VENTOLIN HFA) 108 (90 Base) MCG/ACT inhaler Inhale 2 puffs into the lungs 4 (four) times daily as needed for wheezing or shortness of breath. 3 Inhaler 3   No current facility-administered medications on file prior to visit.    Allergies  Allergen Reactions  . Bee Venom    Social History   Socioeconomic History  . Marital status: Married    Spouse name: Not on file  . Number of children: Not on file  . Years of education: Not on file  . Highest education level: Not  on file  Occupational History  . Not on file  Social Needs  . Financial resource strain: Not on file  . Food insecurity    Worry: Not on file    Inability: Not on file  . Transportation needs    Medical: Not on file    Non-medical: Not on file  Tobacco Use  . Smoking status: Never Smoker  . Smokeless tobacco: Never Used  Substance and Sexual Activity  . Alcohol use: No  . Drug use: No  . Sexual activity: Not on file  Lifestyle  . Physical activity    Days per week: Not on file    Minutes per session: Not on file  . Stress: Not on file  Relationships  . Social Herbalist on phone: Not on file    Gets together: Not on file    Attends religious service: Not on file    Active member of club or organization: Not on file    Attends meetings of clubs or organizations: Not on file    Relationship status: Not on file  . Intimate partner violence    Fear of current or ex partner: Not on file    Emotionally abused: Not on file    Physically abused: Not on file    Forced sexual activity: Not on file  Other Topics Concern  . Not on file  Social History Narrative  . Not on file   Family History  Problem Relation Age of Onset  . Prostate cancer Father        mets  . Arthritis Father   . Hypertension Father   . Arthritis Mother   . Heart disease Mother   . Hypertension Mother   . Stroke Mother   . Lung cancer Brother        was a smoker     Review of Systems  All other systems reviewed and are negative.      Objective:   Physical Exam  Constitutional: He is oriented to person, place, and time. He appears well-developed and well-nourished. No distress.  HENT:  Head: Normocephalic and atraumatic.  Right Ear: External ear normal.  Left Ear: External ear normal.  Nose: Nose normal.  Mouth/Throat: Oropharynx is clear and moist. No oropharyngeal exudate.  Eyes: Pupils are equal, round, and reactive to light. Conjunctivae and EOM are normal. Right eye exhibits  no discharge. Left eye exhibits no discharge. No scleral icterus.  Neck: Normal range of motion. Neck supple. No JVD present. No tracheal deviation present. No thyromegaly present.  Cardiovascular: Normal rate, regular rhythm, normal heart sounds and intact distal pulses. Exam reveals no gallop and no friction rub.  No murmur heard. Pulmonary/Chest: Effort normal and breath sounds normal. No stridor. No respiratory distress. He has no wheezes. He has no rales.  He exhibits no tenderness.  Abdominal: Soft. Bowel sounds are normal. He exhibits no distension and no mass. There is no abdominal tenderness. There is no rebound and no guarding.  Musculoskeletal: Normal range of motion.        General: No tenderness or edema.  Lymphadenopathy:    He has no cervical adenopathy.  Neurological: He is alert and oriented to person, place, and time. He has normal reflexes. No cranial nerve deficit. He exhibits normal muscle tone. Coordination normal.  Skin: Skin is warm. No rash noted. He is not diaphoretic. No erythema. No pallor.  Psychiatric: He has a normal mood and affect. His behavior is normal. Judgment and thought content normal.  Vitals reviewed.         Assessment & Plan:  Prostate cancer screening - Plan: PSA  Screening cholesterol level - Plan: CBC with Differential, COMPLETE METABOLIC PANEL WITH GFR, Lipid Panel  Routine general medical examination at a health care facility  Benign essential HTN  Physical exam today is excellent.  There are no abnormalities found.  Immunizations are up-to-date.  We will screen for colon cancer with a Cologuard.  Will screen for prostate cancer with PSA.  I will check a CBC, CMP, and fasting lipid panel.  Patient denies any depression, memory loss, or falls.  Patient will check his blood pressure daily and notify me if consistently greater than 140/90.

## 2019-03-23 LAB — LIPID PANEL
Cholesterol: 165 mg/dL (ref <200)
HDL: 37 mg/dL — ABNORMAL LOW (ref >=40)
LDL Cholesterol (Calc): 107 mg/dL (calc) — ABNORMAL HIGH
Non-HDL Cholesterol (Calc): 128 mg/dL (calc) (ref <130)
Total CHOL/HDL Ratio: 4.5 (calc) (ref <5.0)
Triglycerides: 115 mg/dL (ref <150)

## 2019-03-23 LAB — CBC WITH DIFFERENTIAL/PLATELET
Absolute Monocytes: 747 cells/uL (ref 200–950)
Basophils Absolute: 51 cells/uL (ref 0–200)
Basophils Relative: 0.9 %
Eosinophils Absolute: 467 cells/uL (ref 15–500)
Eosinophils Relative: 8.2 %
HCT: 49.9 % (ref 38.5–50.0)
Hemoglobin: 16.9 g/dL (ref 13.2–17.1)
Lymphs Abs: 1796 cells/uL (ref 850–3900)
MCH: 30.6 pg (ref 27.0–33.0)
MCHC: 33.9 g/dL (ref 32.0–36.0)
MCV: 90.4 fL (ref 80.0–100.0)
MPV: 12.4 fL (ref 7.5–12.5)
Monocytes Relative: 13.1 %
Neutro Abs: 2639 cells/uL (ref 1500–7800)
Neutrophils Relative %: 46.3 %
Platelets: 132 10*3/uL — ABNORMAL LOW (ref 140–400)
RBC: 5.52 10*6/uL (ref 4.20–5.80)
RDW: 12.1 % (ref 11.0–15.0)
Total Lymphocyte: 31.5 %
WBC: 5.7 10*3/uL (ref 3.8–10.8)

## 2019-03-23 LAB — COMPLETE METABOLIC PANEL WITH GFR
AG Ratio: 2.1 (calc) (ref 1.0–2.5)
ALT: 31 U/L (ref 9–46)
AST: 30 U/L (ref 10–35)
Albumin: 4.4 g/dL (ref 3.6–5.1)
Alkaline phosphatase (APISO): 66 U/L (ref 35–144)
BUN: 18 mg/dL (ref 7–25)
CO2: 29 mmol/L (ref 20–32)
Calcium: 9.6 mg/dL (ref 8.6–10.3)
Chloride: 103 mmol/L (ref 98–110)
Creat: 1.14 mg/dL (ref 0.70–1.18)
GFR, Est African American: 71 mL/min/{1.73_m2} (ref >=60)
GFR, Est Non African American: 61 mL/min/{1.73_m2} (ref >=60)
Globulin: 2.1 g/dL (calc) (ref 1.9–3.7)
Glucose, Bld: 113 mg/dL — ABNORMAL HIGH (ref 65–99)
Potassium: 4.4 mmol/L (ref 3.5–5.3)
Sodium: 140 mmol/L (ref 135–146)
Total Bilirubin: 1 mg/dL (ref 0.2–1.2)
Total Protein: 6.5 g/dL (ref 6.1–8.1)

## 2019-03-23 LAB — PSA: PSA: 1.4 ng/mL (ref <=4.0)

## 2019-03-25 ENCOUNTER — Telehealth: Payer: Self-pay | Admitting: *Deleted

## 2019-03-25 ENCOUNTER — Other Ambulatory Visit: Payer: Self-pay | Admitting: *Deleted

## 2019-03-25 ENCOUNTER — Telehealth: Payer: Self-pay | Admitting: Internal Medicine

## 2019-03-25 MED ORDER — MOMETASONE FUROATE 50 MCG/ACT NA SUSP: 2.0000 | Freq: Every day | NASAL | 11 refills | Status: DC

## 2019-03-25 NOTE — Telephone Encounter (Signed)
Per phone note 10/23, Janeth Rase mailed the forms to [place] 10/23.  Called and spoke with pt letting him know this info that we had mailed the application and all the info he brought to [place] on 10/23. Richardson Landry stated that [place] still hasn't received it. Stated to him that I would print application and Rx both out for him and his wife and have CY sign both and then mail to him for him to send to [employer]. Jian verbalized understanding.   Mardene Celeste and steve both need to have applications filled out for the nasonex along with Rx. Application and Rx for both have been printed. Will give to CY to sign. Nothing further needed.

## 2019-03-25 NOTE — Telephone Encounter (Signed)
-----   Message from Alyson Locket, [place] sent at 03/22/2019  8:47 AM EST -----  ----- Message ----- From: Susy Frizzle, MD Sent: 03/22/2019   8:43 AM EST To: Alyson Locket, RMA  Please schedule cologuard

## 2019-03-25 NOTE — Telephone Encounter (Signed)
Pt states that he would like Rx & pt assistance form for nasonex for HIM to mail to [employer] as we have been working on this for the patient since Sept & they are telling him we haven't done anything for him or his spouse.  Also leaving same message in spoue's chart.

## 2019-03-25 NOTE — Telephone Encounter (Signed)
Received verbal orders for Cologuard.   Order placed via [facility].   Cologuard (Order [id number])

## 2019-03-26 ENCOUNTER — Other Ambulatory Visit: Payer: Self-pay | Admitting: *Deleted

## 2019-03-26 ENCOUNTER — Telehealth: Payer: Self-pay | Admitting: Internal Medicine

## 2019-03-26 MED ORDER — MOMETASONE FUROATE 50 MCG/ACT NA SUSP: 2.0000 | Freq: Every day | NASAL | 3 refills | Status: DC

## 2019-03-26 NOTE — Telephone Encounter (Signed)
Spoke with pt, states that he received a letter from [place] patient assistance saying that he was enrolled in the program but didn't have a rx on file.  Pt called [facility] and they verified that they had not received a Ventolin rx for him.  Pt is asking that this be refaxed to [facility] at [phone], and to put pt ID on cover sheet: [id number]   rx has been pended in pt chart but not printed since CY has left for the weekend.  Will hold message in triage until Monday since CY does not have a regular nurse/cma and next week's schedule is not posted at this time.

## 2019-03-30 MED ORDER — ALBUTEROL SULFATE HFA 108 (90 BASE) MCG/ACT IN AERS: 2.0000 | INHALATION_SPRAY | Freq: Four times a day (QID) | RESPIRATORY_TRACT | 11 refills | Status: DC | PRN

## 2019-03-30 NOTE — Telephone Encounter (Signed)
Script printed and faxed to number provided.   Nothing further needed at this time.

## 2019-04-03 LAB — COLOGUARD: Cologuard: NEGATIVE

## 2019-04-05 DIAGNOSIS — M25542 Pain in joints of left hand: Secondary | ICD-10-CM | POA: Diagnosis not present

## 2019-04-05 DIAGNOSIS — M65322 Trigger finger, left index finger: Secondary | ICD-10-CM | POA: Diagnosis not present

## 2019-04-05 DIAGNOSIS — M6281 Muscle weakness (generalized): Secondary | ICD-10-CM | POA: Diagnosis not present

## 2019-04-05 DIAGNOSIS — M65332 Trigger finger, left middle finger: Secondary | ICD-10-CM | POA: Diagnosis not present

## 2019-04-05 DIAGNOSIS — M25642 Stiffness of left hand, not elsewhere classified: Secondary | ICD-10-CM | POA: Diagnosis not present

## 2019-04-07 DIAGNOSIS — M6281 Muscle weakness (generalized): Secondary | ICD-10-CM | POA: Diagnosis not present

## 2019-04-07 DIAGNOSIS — M65322 Trigger finger, left index finger: Secondary | ICD-10-CM | POA: Diagnosis not present

## 2019-04-07 DIAGNOSIS — M25642 Stiffness of left hand, not elsewhere classified: Secondary | ICD-10-CM | POA: Diagnosis not present

## 2019-04-07 DIAGNOSIS — M25542 Pain in joints of left hand: Secondary | ICD-10-CM | POA: Diagnosis not present

## 2019-04-07 DIAGNOSIS — M65332 Trigger finger, left middle finger: Secondary | ICD-10-CM | POA: Diagnosis not present

## 2019-04-09 ENCOUNTER — Encounter: Payer: Self-pay | Admitting: Family Medicine

## 2019-04-11 DIAGNOSIS — Z1212 Encounter for screening for malignant neoplasm of rectum: Secondary | ICD-10-CM | POA: Diagnosis not present

## 2019-04-11 DIAGNOSIS — Z1211 Encounter for screening for malignant neoplasm of colon: Secondary | ICD-10-CM | POA: Diagnosis not present

## 2019-04-20 NOTE — Telephone Encounter (Signed)
Received the results of Cologuard screening.   Screening noted negative.   A negative result indicates a low likelihood of colorectal cancer is present. Following a negative Cologuard result, the American Cancer Society recommends a Cologuard re-screening interval of 3 years.  

## 2019-05-04 ENCOUNTER — Telehealth: Payer: Self-pay | Admitting: Internal Medicine

## 2019-05-04 NOTE — Telephone Encounter (Signed)
Called [employer] [place] at [phone] and spoke with [employer]. Provided her with the patient's information. She did not see where [employer] received the application. She stated that if the application was mailed back in October 2020, they would have received it by now. She suggested starting over again with a new application.   Provided patient with the information above. He stated that he had called [employer] as well and was told that they did not have an application on file as well. He requested [employer] to send a new application to him and his wife. Once this has been received and filled out, they will mail it to our office. Did verify that he had the correct office address. Will await application.   Nothing further needed at time of call.

## 2019-05-14 NOTE — Telephone Encounter (Signed)
Error

## 2019-05-20 ENCOUNTER — Telehealth: Payer: Self-pay

## 2019-05-20 NOTE — Telephone Encounter (Signed)
Pt needs celebrex assist. Program. [phone]

## 2019-05-21 MED ORDER — CELECOXIB 200 MG PO CAPS: 200.0000 mg | ORAL_CAPSULE | Freq: Every day | ORAL | 3 refills | Status: DC

## 2019-05-21 NOTE — Telephone Encounter (Signed)
Called Patient assistance and they are in need of a new rx to be faxed to them. Rx printed, signed and faxed to [phone]

## 2019-05-26 DIAGNOSIS — R079 Chest pain, unspecified: Secondary | ICD-10-CM | POA: Diagnosis not present

## 2019-05-26 DIAGNOSIS — R0789 Other chest pain: Secondary | ICD-10-CM | POA: Diagnosis not present

## 2019-06-03 ENCOUNTER — Telehealth: Payer: Self-pay | Admitting: Family Medicine

## 2019-06-03 NOTE — Telephone Encounter (Signed)
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 They will not accept paperwork from [place] to provide pt with the vaccine.

## 2019-06-15 ENCOUNTER — Other Ambulatory Visit: Payer: Self-pay | Admitting: Family Medicine

## 2019-06-15 ENCOUNTER — Ambulatory Visit (INDEPENDENT_AMBULATORY_CARE_PROVIDER_SITE_OTHER): Payer: Medicare Other | Admitting: Family Medicine

## 2019-06-15 ENCOUNTER — Other Ambulatory Visit: Payer: Self-pay

## 2019-06-15 DIAGNOSIS — I10 Essential (primary) hypertension: Secondary | ICD-10-CM

## 2019-06-15 DIAGNOSIS — Z23 Encounter for immunization: Secondary | ICD-10-CM | POA: Diagnosis not present

## 2019-06-15 NOTE — Patient Instructions (Signed)
Pt in office and received Shingrix #1 that he received through patient assistance and was shipped to our office for [place] to administer to patient.

## 2019-07-08 DIAGNOSIS — Z23 Encounter for immunization: Secondary | ICD-10-CM | POA: Diagnosis not present

## 2019-08-13 DIAGNOSIS — Z23 Encounter for immunization: Secondary | ICD-10-CM | POA: Diagnosis not present

## 2019-09-16 ENCOUNTER — Telehealth: Payer: Self-pay | Admitting: Family Medicine

## 2019-09-16 NOTE — Chronic Care Management (AMB) (Signed)
  Chronic Care Management   Outreach Note  09/16/2019 Name: Dave Cox MRN: [id number] DOB: August 11, 1940  Referred by: Susy Frizzle, MD Reason for referral : Chronic Care Management   An unsuccessful telephone outreach was attempted today. The patient was referred to the pharmacist for assistance with care management and care coordination.   Follow Up Plan:   [place]

## 2019-09-22 ENCOUNTER — Other Ambulatory Visit: Payer: Self-pay | Admitting: Family Medicine

## 2019-09-22 DIAGNOSIS — I10 Essential (primary) hypertension: Secondary | ICD-10-CM

## 2019-09-30 DIAGNOSIS — L309 Dermatitis, unspecified: Secondary | ICD-10-CM | POA: Diagnosis not present

## 2019-11-11 ENCOUNTER — Other Ambulatory Visit: Payer: Self-pay

## 2019-11-11 MED ORDER — CELECOXIB 200 MG PO CAPS: 200.0000 mg | ORAL_CAPSULE | Freq: Every day | ORAL | 3 refills | Status: DC

## 2019-11-11 NOTE — Telephone Encounter (Signed)
Pt would like Celebrex 30 day supply sent to [facility]

## 2020-01-11 ENCOUNTER — Telehealth: Payer: Self-pay | Admitting: Family Medicine

## 2020-01-11 NOTE — Telephone Encounter (Signed)
Patient called and states that he is on patient assistance program and he needs his 2nd shingrix shot. He needs [place] to send a request to [facility] Patient assistance program phone number is [phone] and the fax [phone] to request his second shingrix shot. His patient id number is [id number].  CB# [phone]

## 2020-01-14 NOTE — Telephone Encounter (Signed)
Prescription assistance form completed and faxed to [place].

## 2020-04-04 ENCOUNTER — Ambulatory Visit (INDEPENDENT_AMBULATORY_CARE_PROVIDER_SITE_OTHER): Payer: Medicare Other | Admitting: Family Medicine

## 2020-04-04 ENCOUNTER — Other Ambulatory Visit: Payer: Self-pay

## 2020-04-04 VITALS — BP 140/90 | HR 76 | Temp 97.4°F | Ht 74.0 in | Wt 191.0 lb

## 2020-04-04 DIAGNOSIS — Z0001 Encounter for general adult medical examination with abnormal findings: Secondary | ICD-10-CM

## 2020-04-04 DIAGNOSIS — E7800 Pure hypercholesterolemia, unspecified: Secondary | ICD-10-CM | POA: Diagnosis not present

## 2020-04-04 DIAGNOSIS — Z23 Encounter for immunization: Secondary | ICD-10-CM

## 2020-04-04 DIAGNOSIS — Z125 Encounter for screening for malignant neoplasm of prostate: Secondary | ICD-10-CM | POA: Diagnosis not present

## 2020-04-04 DIAGNOSIS — I10 Essential (primary) hypertension: Secondary | ICD-10-CM | POA: Diagnosis not present

## 2020-04-04 DIAGNOSIS — Z0000 Encounter for general adult medical examination without abnormal findings: Secondary | ICD-10-CM

## 2020-04-04 MED ORDER — LORAZEPAM 0.5 MG PO TABS: 0.5000 mg | ORAL_TABLET | Freq: Two times a day (BID) | ORAL | 1 refills | Status: DC | PRN

## 2020-04-04 NOTE — Progress Notes (Signed)
Subjective:    Patient ID: Dave Cox, male    DOB: 1940-11-28, 79 y.o.   MRN: [id number]  HPI  Patient is here today for complete physical exam.  Unfortunately since I last saw him, his wife died.  She passed away 2 months ago from supranuclear palsy.  He is still struggling with her death.  He reports difficulty sleeping and is obviously grieving.  He denies any falls or memory loss.  However he is extremely tearful on today's exam.  We spent most of our time discussing the situation with his wife and how he is coping with his ongoing grieving process. Immunization History  Administered Date(s) Administered  . Fluad Quad(high Dose 65+) 02/08/2019  . Influenza Split 03/04/2011  . Influenza, High Dose Seasonal PF 02/17/2017, 03/19/2018  . Influenza,inj,Quad PF,6+ Mos 03/08/2013, 03/13/2015, 04/11/2016  . Influenza-Unspecified 02/04/2003, 02/24/2009, 02/28/2010, 03/04/2011, 03/05/2012, 03/09/2012, 02/23/2014  . Pneumococcal Conjugate-13 03/10/2014  . Pneumococcal Polysaccharide-23 03/04/2011  . Td 12/29/2007  . Zoster Recombinat (Shingrix) 06/15/2019    Past Medical History:  Diagnosis Date  . Abnormal EKG   . Anxiety   . CAD (coronary artery disease)   . Cervical osteoarthritis   . COPD (chronic obstructive pulmonary disease) ([place])   . GERD (gastroesophageal reflux disease)   . HTN (hypertension)   . Hyperlipidemia   . Melanoma ([place])    eye  . Nephrolithiasis    Past Surgical History:  Procedure Laterality Date  . BASAL CELL CARCINOMA EXCISION    . CARDIAC CATHETERIZATION    . HERNIA REPAIR    . KIDNEY STONE SURGERY    . LUMBAR DISC SURGERY    . MELANOMA EXCISION     eye  . STRABISMUS SURGERY Bilateral 08/23/2016   Procedure: REPAIR STRABISMUS;  Surgeon: [name], MD;  Location: [facility];  Service: Ophthalmology;  Laterality: Bilateral;  . STRABISMUS SURGERY Left 06/06/2017   Procedure: STRABISMUS REPAIR;  Surgeon: [name], MD;  Location:  [facility];  Service: Ophthalmology;  Laterality: Left;  . TONSILLECTOMY     Current Outpatient Medications on File Prior to Visit  Medication Sig Dispense Refill  . aspirin EC 81 MG tablet Take 81 mg by mouth daily.    . celecoxib (CELEBREX) 200 MG capsule Take 1 capsule (200 mg total) by mouth daily. 30 capsule 3  . EPINEPHrine 0.3 mg/0.3 mL IJ SOAJ injection Inject 0.3 mLs (0.3 mg total) into the muscle as needed. 2 Device 3  . hydrOXYzine (ATARAX/VISTARIL) 25 MG tablet Take 1 tablet (25 mg total) by mouth 3 (three) times daily as needed. 30 tablet 0  . losartan (COZAAR) 100 MG tablet TAKE ONE TABLET BY MOUTH DAILY 90 tablet 3  . mometasone (NASONEX) 50 MCG/ACT nasal spray Place 2 sprays into the nose daily. 17 g 3  . NEXIUM 40 MG packet Take 40 mg by mouth daily before breakfast. 90 each 3  . rosuvastatin (CRESTOR) 20 MG tablet Take 1 tablet (20 mg total) by mouth daily. 90 tablet 3  . triamcinolone cream (KENALOG) 0.1 % apply topically twice daily FOR 14 DAYS  0  . albuterol (VENTOLIN HFA) 108 (90 Base) MCG/ACT inhaler Inhale 2 puffs into the lungs 4 (four) times daily as needed for wheezing or shortness of breath. 18 g 11  . LORATADINE PO Take 10 mg by mouth as needed.  (Patient not taking: Reported on 04/04/2020)     No current facility-administered medications on file prior to visit.  Allergies  Allergen Reactions  . Bee Venom    Social History   Socioeconomic History  . Marital status: Married    Spouse name: Not on file  . Number of children: Not on file  . Years of education: Not on file  . Highest education level: Not on file  Occupational History  . Not on file  Tobacco Use  . Smoking status: Never Smoker  . Smokeless tobacco: Never Used  Substance and Sexual Activity  . Alcohol use: No  . Drug use: No  . Sexual activity: Not on file  Other Topics Concern  . Not on file  Social History Narrative  . Not on file   Social Determinants of Health     Financial Resource Strain:   . Difficulty of Paying Living Expenses: Not on file  Food Insecurity:   . Worried About Charity fundraiser in the Last Year: Not on file  . Ran Out of Food in the Last Year: Not on file  Transportation Needs:   . Lack of Transportation (Medical): Not on file  . Lack of Transportation (Non-Medical): Not on file  Physical Activity:   . Days of Exercise per Week: Not on file  . Minutes of Exercise per Session: Not on file  Stress:   . Feeling of Stress : Not on file  Social Connections:   . Frequency of Communication with Friends and Family: Not on file  . Frequency of Social Gatherings with Friends and Family: Not on file  . Attends Religious Services: Not on file  . Active Member of Clubs or Organizations: Not on file  . Attends Archivist Meetings: Not on file  . Marital Status: Not on file  Intimate Partner Violence:   . Fear of Current or Ex-Partner: Not on file  . Emotionally Abused: Not on file  . Physically Abused: Not on file  . Sexually Abused: Not on file   Family History  Problem Relation Age of Onset  . Prostate cancer Father        mets  . Arthritis Father   . Hypertension Father   . Arthritis Mother   . Heart disease Mother   . Hypertension Mother   . Stroke Mother   . Lung cancer Brother        was a smoker     Review of Systems  All other systems reviewed and are negative.      Objective:   Physical Exam Vitals reviewed.  Constitutional:      General: He is not in acute distress.    Appearance: He is well-developed. He is not diaphoretic.  HENT:     Head: Normocephalic and atraumatic.     Right Ear: External ear normal.     Left Ear: External ear normal.     Nose: Nose normal.     Mouth/Throat:     Pharynx: No oropharyngeal exudate.  Eyes:     General: No scleral icterus.       Right eye: No discharge.        Left eye: No discharge.     Conjunctiva/sclera: Conjunctivae normal.     Pupils: Pupils  are equal, round, and reactive to light.  Neck:     Thyroid: No thyromegaly.     Vascular: No JVD.     Trachea: No tracheal deviation.  Cardiovascular:     Rate and Rhythm: Normal rate and regular rhythm.     Heart sounds: Normal heart sounds.  No murmur heard.  No friction rub. No gallop.   Pulmonary:     Effort: Pulmonary effort is normal. No respiratory distress.     Breath sounds: Normal breath sounds. No stridor. No wheezing or rales.  Chest:     Chest wall: No tenderness.  Abdominal:     General: Bowel sounds are normal. There is no distension.     Palpations: Abdomen is soft. There is no mass.     Tenderness: There is no abdominal tenderness. There is no guarding or rebound.  Musculoskeletal:        General: No tenderness. Normal range of motion.     Cervical back: Normal range of motion and neck supple.  Lymphadenopathy:     Cervical: No cervical adenopathy.  Skin:    General: Skin is warm.     Coloration: Skin is not pale.     Findings: No erythema or rash.  Neurological:     Mental Status: He is alert and oriented to person, place, and time.     Cranial Nerves: No cranial nerve deficit.     Motor: No abnormal muscle tone.     Coordination: Coordination normal.     Deep Tendon Reflexes: Reflexes are normal and symmetric.  Psychiatric:        Behavior: Behavior normal.        Thought Content: Thought content normal.        Judgment: Judgment normal.           Assessment & Plan:  Pure hypercholesterolemia - Plan: CBC with Differential/Platelet, COMPLETE METABOLIC PANEL WITH GFR, Lipid panel  Prostate cancer screening - Plan: PSA  Need for immunization against influenza - Plan: Flu Vaccine QUAD High Dose(Fluad)  Benign essential HTN  Routine general medical examination at a health care facility  All for my deepest sympathy is to the patient losing his wife.  We will start Ativan 0.5 mg p.o. every 12 hours as needed anxiety or insomnia.  He will use this  medication sparingly as needed.  He is not due for any colonoscopy as he had a negative Cologuard last year.  He would like to check a PSA today.  We will also check a CBC, CMP, fasting lipid panel.  Blood pressure today is acceptable given his age.  He received his flu shot.  I recommended a booster on his Covid vaccination.

## 2020-04-05 ENCOUNTER — Other Ambulatory Visit: Payer: Self-pay

## 2020-04-05 LAB — COMPLETE METABOLIC PANEL WITH GFR
AG Ratio: 1.9 (calc) (ref 1.0–2.5)
ALT: 40 U/L (ref 9–46)
AST: 35 U/L (ref 10–35)
Albumin: 4.6 g/dL (ref 3.6–5.1)
Alkaline phosphatase (APISO): 62 U/L (ref 35–144)
BUN: 17 mg/dL (ref 7–25)
CO2: 28 mmol/L (ref 20–32)
Calcium: 9.9 mg/dL (ref 8.6–10.3)
Chloride: 101 mmol/L (ref 98–110)
Creat: 1.18 mg/dL (ref 0.70–1.18)
GFR, Est African American: 68 mL/min/{1.73_m2} (ref >=60)
GFR, Est Non African American: 58 mL/min/{1.73_m2} — ABNORMAL LOW (ref >=60)
Globulin: 2.4 g/dL (calc) (ref 1.9–3.7)
Glucose, Bld: 103 mg/dL — ABNORMAL HIGH (ref 65–99)
Potassium: 4.5 mmol/L (ref 3.5–5.3)
Sodium: 140 mmol/L (ref 135–146)
Total Bilirubin: 1.3 mg/dL — ABNORMAL HIGH (ref 0.2–1.2)
Total Protein: 7 g/dL (ref 6.1–8.1)

## 2020-04-05 LAB — CBC WITH DIFFERENTIAL/PLATELET
Absolute Monocytes: 936 cells/uL (ref 200–950)
Basophils Absolute: 60 cells/uL (ref 0–200)
Basophils Relative: 1 %
Eosinophils Absolute: 324 cells/uL (ref 15–500)
Eosinophils Relative: 5.4 %
HCT: 49.9 % (ref 38.5–50.0)
Hemoglobin: 17.4 g/dL — ABNORMAL HIGH (ref 13.2–17.1)
Lymphs Abs: 2022 cells/uL (ref 850–3900)
MCH: 31 pg (ref 27.0–33.0)
MCHC: 34.9 g/dL (ref 32.0–36.0)
MCV: 88.8 fL (ref 80.0–100.0)
MPV: 11.8 fL (ref 7.5–12.5)
Monocytes Relative: 15.6 %
Neutro Abs: 2658 cells/uL (ref 1500–7800)
Neutrophils Relative %: 44.3 %
Platelets: 142 10*3/uL (ref 140–400)
RBC: 5.62 10*6/uL (ref 4.20–5.80)
RDW: 12.1 % (ref 11.0–15.0)
Total Lymphocyte: 33.7 %
WBC: 6 10*3/uL (ref 3.8–10.8)

## 2020-04-05 LAB — PSA: PSA: 1.79 ng/mL (ref <=4.0)

## 2020-04-05 LAB — LIPID PANEL
Cholesterol: 211 mg/dL — ABNORMAL HIGH (ref <200)
HDL: 34 mg/dL — ABNORMAL LOW (ref >=40)
LDL Cholesterol (Calc): 143 mg/dL (calc) — ABNORMAL HIGH
Non-HDL Cholesterol (Calc): 177 mg/dL (calc) — ABNORMAL HIGH (ref <130)
Total CHOL/HDL Ratio: 6.2 (calc) — ABNORMAL HIGH (ref <5.0)
Triglycerides: 198 mg/dL — ABNORMAL HIGH (ref <150)

## 2020-04-05 MED ORDER — CELECOXIB 200 MG PO CAPS
200.0000 mg | ORAL_CAPSULE | Freq: Every day | ORAL | 3 refills | Status: DC
Start: 2020-04-05 — End: 2020-04-07

## 2020-04-07 ENCOUNTER — Other Ambulatory Visit: Payer: Self-pay

## 2020-04-07 MED ORDER — CELECOXIB 200 MG PO CAPS
200.0000 mg | ORAL_CAPSULE | Freq: Every day | ORAL | 3 refills | Status: DC
Start: 2020-04-07 — End: 2020-05-09

## 2020-04-14 DIAGNOSIS — Z23 Encounter for immunization: Secondary | ICD-10-CM | POA: Diagnosis not present

## 2020-05-09 ENCOUNTER — Telehealth: Payer: Self-pay | Admitting: Family Medicine

## 2020-05-09 MED ORDER — CELECOXIB 200 MG PO CAPS
200.0000 mg | ORAL_CAPSULE | Freq: Every day | ORAL | 3 refills | Status: DC
Start: 2020-05-09 — End: 2020-12-14

## 2020-05-09 NOTE — Telephone Encounter (Signed)
Prescription sent to pharmacy.

## 2020-06-27 ENCOUNTER — Other Ambulatory Visit: Payer: Self-pay | Admitting: *Deleted

## 2020-06-27 MED ORDER — LORAZEPAM 0.5 MG PO TABS
0.5000 mg | ORAL_TABLET | Freq: Two times a day (BID) | ORAL | 1 refills | Status: DC | PRN
Start: 2020-06-27 — End: 2020-09-01

## 2020-06-27 NOTE — Telephone Encounter (Signed)
Received call from patient.   Reports that wife passed recently and he is having increased stress/ anxiety.   Requested refill on Ativan.   Ok to refill??  Last office visit/ refill 04/04/2020, #1 refill.

## 2020-07-14 ENCOUNTER — Telehealth: Payer: Self-pay | Admitting: Family Medicine

## 2020-07-14 MED ORDER — ROSUVASTATIN CALCIUM 20 MG PO TABS
20.0000 mg | ORAL_TABLET | Freq: Every day | ORAL | 3 refills | Status: DC
Start: 2020-07-14 — End: 2020-07-14

## 2020-07-14 MED ORDER — ROSUVASTATIN CALCIUM 20 MG PO TABS
20.0000 mg | ORAL_TABLET | Freq: Every day | ORAL | 3 refills | Status: DC
Start: 2020-07-14 — End: 2020-07-24

## 2020-07-14 NOTE — Telephone Encounter (Signed)
Prescription sent to pharmacy.

## 2020-07-14 NOTE — Telephone Encounter (Signed)
Patient called requesting a refill on his crestor 10 mg called into [facility] he is requesting a 90 day supply.  CB# [phone]

## 2020-07-24 ENCOUNTER — Other Ambulatory Visit: Payer: Self-pay

## 2020-07-24 MED ORDER — ROSUVASTATIN CALCIUM 20 MG PO TABS
20.0000 mg | ORAL_TABLET | Freq: Every day | ORAL | 3 refills | Status: DC
Start: 2020-07-24 — End: 2022-03-25

## 2020-09-01 ENCOUNTER — Other Ambulatory Visit: Payer: Self-pay | Admitting: *Deleted

## 2020-09-01 MED ORDER — LORAZEPAM 0.5 MG PO TABS
0.5000 mg | ORAL_TABLET | Freq: Two times a day (BID) | ORAL | 1 refills | Status: DC | PRN
Start: 2020-09-01 — End: 2020-09-07

## 2020-09-01 NOTE — Telephone Encounter (Signed)
Received fax requesting refill on Lorazepam.  Ok to refill??  Last office visit 04/04/2020.  Last refill 06/27/2020, #1 refill.

## 2020-09-07 ENCOUNTER — Other Ambulatory Visit: Payer: Self-pay | Admitting: *Deleted

## 2020-09-07 MED ORDER — LORAZEPAM 0.5 MG PO TABS
0.5000 mg | ORAL_TABLET | Freq: Two times a day (BID) | ORAL | 1 refills | Status: DC | PRN
Start: 2020-09-07 — End: 2020-11-14

## 2020-09-07 NOTE — Telephone Encounter (Signed)
Received VM from patient.   Requested refill on Ativan.   Ok to refill??  Last refill on 09/01/2020 was sent to incorrect pharmacy.

## 2020-09-29 ENCOUNTER — Other Ambulatory Visit: Payer: Self-pay

## 2020-09-29 DIAGNOSIS — I10 Essential (primary) hypertension: Secondary | ICD-10-CM

## 2020-09-29 MED ORDER — LOSARTAN POTASSIUM 100 MG PO TABS: 1.0000 | ORAL_TABLET | Freq: Every day | ORAL | 3 refills | Status: DC

## 2020-10-30 ENCOUNTER — Telehealth: Payer: Self-pay | Admitting: *Deleted

## 2020-10-30 NOTE — Telephone Encounter (Signed)
Received call from patient.   Requested F/U from [place] in regards to prescription assistance for Shingrix.   Call placed to [place]. Was advised that program has undergone some upgrades. Will have application from 32/7614 reviewed for injection #2. States that if new application is required, [place] will notify PCP.

## 2020-11-14 ENCOUNTER — Other Ambulatory Visit: Payer: Self-pay | Admitting: Family Medicine

## 2020-12-14 ENCOUNTER — Other Ambulatory Visit: Payer: Self-pay | Admitting: *Deleted

## 2020-12-14 MED ORDER — CELECOXIB 200 MG PO CAPS
200.0000 mg | ORAL_CAPSULE | Freq: Every day | ORAL | 3 refills | Status: DC
Start: 2020-12-14 — End: 2021-12-19

## 2020-12-29 ENCOUNTER — Other Ambulatory Visit: Payer: Self-pay | Admitting: Family Medicine

## 2020-12-29 NOTE — Telephone Encounter (Signed)
Ok to refill??  Last office visit 04/04/2020.  Last refill 11/14/2020.

## 2021-02-06 ENCOUNTER — Other Ambulatory Visit: Payer: Self-pay | Admitting: *Deleted

## 2021-02-06 NOTE — Telephone Encounter (Signed)
Received call from patient.   Requested refill on Ativan.   Ok to refill??  Last office visit 04/04/2020.  Last refill 12/29/2020.  Letter sent to patient to schedule appointment.

## 2021-02-08 MED ORDER — LORAZEPAM 0.5 MG PO TABS: ORAL_TABLET | ORAL | 0 refills | Status: DC

## 2021-02-09 ENCOUNTER — Other Ambulatory Visit: Payer: Self-pay | Admitting: *Deleted

## 2021-02-09 MED ORDER — LORAZEPAM 0.5 MG PO TABS: ORAL_TABLET | ORAL | 0 refills | Status: DC

## 2021-02-09 NOTE — Telephone Encounter (Signed)
Ativan sent to wrong pharmacy in error.   Please re-send to [facility].

## 2021-03-22 ENCOUNTER — Other Ambulatory Visit: Payer: Self-pay | Admitting: *Deleted

## 2021-03-22 MED ORDER — LORAZEPAM 0.5 MG PO TABS: ORAL_TABLET | ORAL | 0 refills | Status: DC

## 2021-03-22 NOTE — Telephone Encounter (Signed)
Received call from patient.   Requested refill on Ativan.   Ok to refill??  Last office visit 04/04/2020.  Last refill 02/09/2021.  Of note, letter sent to patient to schedule appointment on 02/06/2021. Appointment scheduled for 04/03/2021.

## 2021-03-23 MED ORDER — LORAZEPAM 0.5 MG PO TABS: ORAL_TABLET | ORAL | 0 refills | Status: DC

## 2021-03-23 NOTE — Addendum Note (Signed)
Addended by: Sheral Flow on: 03/23/2021 12:51 PM   Modules accepted: Orders

## 2021-03-23 NOTE — Telephone Encounter (Signed)
Prescription sent to incorrect pharmacy.   Please re-send to [facility].

## 2021-03-23 NOTE — Telephone Encounter (Signed)
PDMP reviewed.  Patient takes this medication chronically.  Refill given in place of PCP who is out of the office.

## 2021-03-27 ENCOUNTER — Encounter: Payer: Medicare Other | Admitting: Family Medicine

## 2021-04-02 ENCOUNTER — Telehealth: Payer: Self-pay | Admitting: Family Medicine

## 2021-04-02 NOTE — Telephone Encounter (Signed)
Patient called to follow up on previous conversation with nurse about receiving 2nd shingles shot. Patient states 1st shot received 06/29/19; advised to return 3-6 months later for 2nd shot. Patient had insurance coverage through [facility] Patient Insurance at the time; inactive as of 04/25/20.   Patient lost spouse last year (October of 2021) and never went to get 2nd shingles shot; states he spoke with nurse here who agreed to follow up with [facility] Patient Insurance to get approval for coverage for 2nd injection. Patient asked if offer still stands; contact [place] Patient Insurance at [phone].  Please advise patient at [phone].

## 2021-04-03 ENCOUNTER — Encounter: Payer: Medicare Other | Admitting: Family Medicine

## 2021-04-03 NOTE — Telephone Encounter (Signed)
Spoke wth [place] patient assistance and they need a new application.   LMTRC to let pt know. I have printed the application and will keep on hand for next OV, or if pt would like to come by and complete form that is fine.

## 2021-04-03 NOTE — Telephone Encounter (Signed)
Spoke with pt and advised. He will complete form when he comes in for OV 04/12/21. Form printed and held. Nothing further needed.

## 2021-04-12 ENCOUNTER — Ambulatory Visit (INDEPENDENT_AMBULATORY_CARE_PROVIDER_SITE_OTHER): Payer: Medicare Other | Admitting: Family Medicine

## 2021-04-12 ENCOUNTER — Other Ambulatory Visit: Payer: Self-pay

## 2021-04-12 VITALS — BP 146/100 | HR 61 | Temp 98.0°F | Resp 16 | Wt 186.0 lb

## 2021-04-12 DIAGNOSIS — E7800 Pure hypercholesterolemia, unspecified: Secondary | ICD-10-CM

## 2021-04-12 DIAGNOSIS — I10 Essential (primary) hypertension: Secondary | ICD-10-CM

## 2021-04-12 DIAGNOSIS — Z23 Encounter for immunization: Secondary | ICD-10-CM | POA: Diagnosis not present

## 2021-04-12 DIAGNOSIS — Z0000 Encounter for general adult medical examination without abnormal findings: Secondary | ICD-10-CM | POA: Diagnosis not present

## 2021-04-12 DIAGNOSIS — Z125 Encounter for screening for malignant neoplasm of prostate: Secondary | ICD-10-CM

## 2021-04-12 NOTE — Progress Notes (Signed)
Subjective:    Patient ID: Dave Cox, male    DOB: 02-27-1941, 80 y.o.   MRN: [id number]  HPI  Patient is here today for complete physical exam.  His wife died in 08-09-19 from supranuclear palsy.  Blood pressure is elevated today.  However he is checking his blood pressure at home and finds that it is much better.  Due to his age he does not require colonoscopy.  However he request to have his PSA checked because he continues to have symptoms of BPH.  He denies any hematuria or dysuria.  He denies any falls or depression or memory loss although he is still grieving over the loss of his wife.  He stays active reading his Bible which helps him past.  He is due for the second shingles shot.  He is also due for a flu shot.  He is due for the most recent valent COVID booster Immunization History  Administered Date(s) Administered   Fluad Quad(high Dose 65+) 02/08/2019, 04/04/2020   Influenza Split 03/04/2011   Influenza, High Dose Seasonal PF 02/17/2017, 03/19/2018   Influenza,inj,Quad PF,6+ Mos 03/08/2013, 03/13/2015, 04/11/2016   Influenza-Unspecified 02/04/2003, 02/24/2009, 02/28/2010, 03/04/2011, 03/05/2012, 03/09/2012, 02/23/2014   Pneumococcal Conjugate-13 03/10/2014   Pneumococcal Polysaccharide-23 03/04/2011   Td 12/29/2007   Zoster Recombinat (Shingrix) 06/15/2019    Past Medical History:  Diagnosis Date   Abnormal EKG    Anxiety    CAD (coronary artery disease)    Cervical osteoarthritis    COPD (chronic obstructive pulmonary disease) ([facility])    GERD (gastroesophageal reflux disease)    HTN (hypertension)    Hyperlipidemia    Melanoma ([place])    eye   Nephrolithiasis    Past Surgical History:  Procedure Laterality Date   BASAL CELL CARCINOMA EXCISION     CARDIAC CATHETERIZATION     HERNIA REPAIR     KIDNEY STONE SURGERY     LUMBAR DISC SURGERY     MELANOMA EXCISION     eye   STRABISMUS SURGERY Bilateral 08/23/2016   Procedure: REPAIR STRABISMUS;  Surgeon: [name], MD;  Location: [facility];  Service: Ophthalmology;  Laterality: Bilateral;   STRABISMUS SURGERY Left 06/06/2017   Procedure: STRABISMUS REPAIR;  Surgeon: [name], MD;  Location: [facility];  Service: Ophthalmology;  Laterality: Left;   TONSILLECTOMY     Current Outpatient Medications on File Prior to Visit  Medication Sig Dispense Refill   albuterol (VENTOLIN HFA) 108 (90 Base) MCG/ACT inhaler Inhale 2 puffs into the lungs 4 (four) times daily as needed for wheezing or shortness of breath. 18 g 11   ascorbic acid (VITAMIN C) 500 MG tablet Take by mouth.     aspirin EC 81 MG tablet Take 81 mg by mouth daily.     celecoxib (CELEBREX) 200 MG capsule Take 1 capsule (200 mg total) by mouth daily. 90 capsule 3   EPINEPHrine 0.3 mg/0.3 mL IJ SOAJ injection Inject 0.3 mLs (0.3 mg total) into the muscle as needed. 2 Device 3   hydrOXYzine (ATARAX/VISTARIL) 25 MG tablet Take 1 tablet (25 mg total) by mouth 3 (three) times daily as needed. 30 tablet 0   LORATADINE PO Take 10 mg by mouth as needed.  (Patient not taking: Reported on 04/04/2020)     LORazepam (ATIVAN) 0.5 MG tablet TAKE ONE TABLET BY MOUTH TWICE A DAY AS NEEDED FOR ANXIETY 30 tablet 0   losartan (COZAAR) 100 MG tablet Take 1 tablet (  100 mg total) by mouth daily. 90 tablet 3   mometasone (NASONEX) 50 MCG/ACT nasal spray Place 2 sprays into the nose daily. 17 g 3   NEXIUM 40 MG packet Take 40 mg by mouth daily before breakfast. 90 each 3   rosuvastatin (CRESTOR) 20 MG tablet Take 1 tablet (20 mg total) by mouth daily. 90 tablet 3   triamcinolone cream (KENALOG) 0.1 % apply topically twice daily FOR 14 DAYS  0   No current facility-administered medications on file prior to visit.   Allergies  Allergen Reactions   Bee Venom    Social History   Socioeconomic History   Marital status: Widowed    Spouse name: Not on file   Number of children: Not on file   Years of education: Not on file    Highest education level: Not on file  Occupational History   Not on file  Tobacco Use   Smoking status: Never   Smokeless tobacco: Never  Substance and Sexual Activity   Alcohol use: No   Drug use: No   Sexual activity: Not on file  Other Topics Concern   Not on file  Social History Narrative   Not on file   Social Determinants of Health   Financial Resource Strain: Not on file  Food Insecurity: Not on file  Transportation Needs: Not on file  Physical Activity: Not on file  Stress: Not on file  Social Connections: Not on file  Intimate Partner Violence: Not on file   Family History  Problem Relation Age of Onset   Prostate cancer Father        mets   Arthritis Father    Hypertension Father    Arthritis Mother    Heart disease Mother    Hypertension Mother    Stroke Mother    Lung cancer Brother        was a smoker     Review of Systems  All other systems reviewed and are negative.     Objective:   Physical Exam Vitals reviewed.  Constitutional:      General: He is not in acute distress.    Appearance: He is well-developed. He is not diaphoretic.  HENT:     Head: Normocephalic and atraumatic.     Right Ear: External ear normal.     Left Ear: External ear normal.     Nose: Nose normal.     Mouth/Throat:     Pharynx: No oropharyngeal exudate.  Eyes:     General: No scleral icterus.       Right eye: No discharge.        Left eye: No discharge.     Conjunctiva/sclera: Conjunctivae normal.     Pupils: Pupils are equal, round, and reactive to light.  Neck:     Thyroid: No thyromegaly.     Vascular: No JVD.     Trachea: No tracheal deviation.  Cardiovascular:     Rate and Rhythm: Normal rate and regular rhythm.     Heart sounds: Normal heart sounds. No murmur heard.   No friction rub. No gallop.  Pulmonary:     Effort: Pulmonary effort is normal. No respiratory distress.     Breath sounds: Normal breath sounds. No stridor. No wheezing or rales.   Chest:     Chest wall: No tenderness.  Abdominal:     General: Bowel sounds are normal. There is no distension.     Palpations: Abdomen is soft. There is no  mass.     Tenderness: There is no abdominal tenderness. There is no guarding or rebound.  Musculoskeletal:        General: No tenderness. Normal range of motion.     Cervical back: Normal range of motion and neck supple.  Lymphadenopathy:     Cervical: No cervical adenopathy.  Skin:    General: Skin is warm.     Coloration: Skin is not pale.     Findings: No erythema or rash.  Neurological:     Mental Status: He is alert and oriented to person, place, and time.     Cranial Nerves: No cranial nerve deficit.     Motor: No abnormal muscle tone.     Coordination: Coordination normal.     Deep Tendon Reflexes: Reflexes are normal and symmetric.  Psychiatric:        Behavior: Behavior normal.        Thought Content: Thought content normal.        Judgment: Judgment normal.          Assessment & Plan:  Routine general medical examination at a health care facility  Benign essential HTN - Plan: CBC with Differential/Platelet, CULTURE, ROUTINE-ABSCESS, Lipid panel  Pure hypercholesterolemia - Plan: CBC with Differential/Platelet, CULTURE, ROUTINE-ABSCESS, Lipid panel  Prostate cancer screening - Plan: PSA  Need for immunization against influenza - Plan: Flu Vaccine QUAD High Dose(Fluad) I have asked the patient to check his blood pressure every day and notify me of the values next week.  If consistently greater than 140/90 we will progress his blood pressure more aggressively.  He received his flu shot today.  I will check a CBC CMP and lipid panel.  Check a PSA per the patient's request for colon cancer screening.  Colonoscopy is not required.  Recommended the shingles vaccine, following COVID shot.

## 2021-04-13 LAB — COMPLETE METABOLIC PANEL WITH GFR
AG Ratio: 1.9 (calc) (ref 1.0–2.5)
ALT: 26 U/L (ref 9–46)
AST: 26 U/L (ref 10–35)
Albumin: 4.4 g/dL (ref 3.6–5.1)
Alkaline phosphatase (APISO): 64 U/L (ref 35–144)
BUN: 20 mg/dL (ref 7–25)
CO2: 27 mmol/L (ref 20–32)
Calcium: 9.5 mg/dL (ref 8.6–10.3)
Chloride: 103 mmol/L (ref 98–110)
Creat: 1.06 mg/dL (ref 0.70–1.22)
Globulin: 2.3 g/dL (calc) (ref 1.9–3.7)
Glucose, Bld: 95 mg/dL (ref 65–99)
Potassium: 5 mmol/L (ref 3.5–5.3)
Sodium: 141 mmol/L (ref 135–146)
Total Bilirubin: 1.1 mg/dL (ref 0.2–1.2)
Total Protein: 6.7 g/dL (ref 6.1–8.1)
eGFR: 71 mL/min/{1.73_m2} (ref >=60)

## 2021-04-13 LAB — CBC WITH DIFFERENTIAL/PLATELET
Absolute Monocytes: 874 cells/uL (ref 200–950)
Basophils Absolute: 50 cells/uL (ref 0–200)
Basophils Relative: 0.9 %
Eosinophils Absolute: 381 cells/uL (ref 15–500)
Eosinophils Relative: 6.8 %
HCT: 50 % (ref 38.5–50.0)
Hemoglobin: 16.9 g/dL (ref 13.2–17.1)
Lymphs Abs: 1971 cells/uL (ref 850–3900)
MCH: 31.6 pg (ref 27.0–33.0)
MCHC: 33.8 g/dL (ref 32.0–36.0)
MCV: 93.6 fL (ref 80.0–100.0)
MPV: 12.4 fL (ref 7.5–12.5)
Monocytes Relative: 15.6 %
Neutro Abs: 2324 cells/uL (ref 1500–7800)
Neutrophils Relative %: 41.5 %
Platelets: 131 10*3/uL — ABNORMAL LOW (ref 140–400)
RBC: 5.34 10*6/uL (ref 4.20–5.80)
RDW: 11.8 % (ref 11.0–15.0)
Total Lymphocyte: 35.2 %
WBC: 5.6 10*3/uL (ref 3.8–10.8)

## 2021-04-13 LAB — LIPID PANEL
Cholesterol: 206 mg/dL — ABNORMAL HIGH (ref <200)
HDL: 39 mg/dL — ABNORMAL LOW (ref >=40)
LDL Cholesterol (Calc): 143 mg/dL (calc) — ABNORMAL HIGH
Non-HDL Cholesterol (Calc): 167 mg/dL (calc) — ABNORMAL HIGH (ref <130)
Total CHOL/HDL Ratio: 5.3 (calc) — ABNORMAL HIGH (ref <5.0)
Triglycerides: 121 mg/dL (ref <150)

## 2021-04-13 LAB — PSA: PSA: 2.03 ng/mL (ref <=4.00)

## 2021-04-16 ENCOUNTER — Telehealth: Payer: Self-pay

## 2021-04-16 NOTE — Telephone Encounter (Signed)
Pt called in about lab results. Pt stated that he received a message to return call about results. Please advise.  Cb#: [phone]

## 2021-04-16 NOTE — Telephone Encounter (Signed)
Spoke with pt and gave results. Copy of LOV and labs mailed to pt's home address as requested.

## 2021-05-29 ENCOUNTER — Other Ambulatory Visit: Payer: Self-pay | Admitting: Nurse Practitioner

## 2021-05-29 NOTE — Telephone Encounter (Signed)
Ativan refill request.  Last seen 04/12/2021, last filled 03/23/2021.

## 2021-07-09 ENCOUNTER — Other Ambulatory Visit: Payer: Self-pay

## 2021-07-09 NOTE — Telephone Encounter (Signed)
Pharmacy faxed refill request for  ? ?LORazepam (ATIVAN) 0.5 MG tablet [[id number]]  ? ?Dose, Route, Frequency: As Directed  ?Dispense Quantity: 30 tablet Refills: 0   ?     ?Sig: TAKE ONE TABLET BY MOUTH TWICE A DAY AS NEEDED FOR ANXIETY  ?     ?Start Date: 05/29/21 End Date: --  ?Written Date: 05/29/21 Expiration Date: 11/25/21  ?Original Order:  LORazepam (ATIVAN) 0.5 MG tablet [[id number]]  ? ?

## 2021-07-10 NOTE — Telephone Encounter (Signed)
LOV 04/12/21 ?Last refill 05/29/21, #30, 0 refills ? ?Please review, thanks! ?

## 2021-07-11 MED ORDER — LORAZEPAM 0.5 MG PO TABS: 0.5000 mg | ORAL_TABLET | Freq: Two times a day (BID) | ORAL | 0 refills | Status: DC | PRN

## 2021-07-13 MED ORDER — LORAZEPAM 0.5 MG PO TABS: 0.5000 mg | ORAL_TABLET | Freq: Two times a day (BID) | ORAL | 0 refills | Status: DC | PRN

## 2021-07-13 NOTE — Addendum Note (Signed)
Addended by: Wadie Lessen on: 07/13/2021 12:46 PM ? ? Modules accepted: Orders ? ?

## 2021-07-13 NOTE — Telephone Encounter (Signed)
Patient called and would like rx sent to [facility]. Recent rx sent to mail order. Patient does not want to use this pharmacy and is only using [facility] now. Chart has been updated.  ? ?Please resend rx, thanks! ?

## 2021-08-20 ENCOUNTER — Other Ambulatory Visit: Payer: Self-pay | Admitting: Family Medicine

## 2021-08-20 NOTE — Telephone Encounter (Signed)
Received vm asking for a refill on  ?LORazepam (ATIVAN) 0.5 MG tablet [[id number]]  ?  Order Details ?Dose: 0.5 mg Route: Oral Frequency: 2 times daily PRN for anxiety  ?Dispense Quantity: 30 tablet Refills: 0   ?     ?Sig: Take 1 tablet (0.5 mg total) by mouth 2 (two) times daily as needed for anxiety.  ? ?

## 2021-08-21 MED ORDER — LORAZEPAM 0.5 MG PO TABS: 0.5000 mg | ORAL_TABLET | Freq: Two times a day (BID) | ORAL | 0 refills | Status: DC | PRN

## 2021-08-21 NOTE — Telephone Encounter (Signed)
LOV 04/12/21 ?Last refill 07/13/21, #30, 0 refills ? ?Please review, thanks! ? ?

## 2021-10-02 ENCOUNTER — Other Ambulatory Visit: Payer: Self-pay | Admitting: Family Medicine

## 2021-10-02 DIAGNOSIS — I10 Essential (primary) hypertension: Secondary | ICD-10-CM

## 2021-10-05 ENCOUNTER — Other Ambulatory Visit: Payer: Self-pay

## 2021-10-05 NOTE — Telephone Encounter (Signed)
Pharmacy faxed a refill request for LORazepam (ATIVAN) 0.5 MG tablet [[id number]]    Order Details Dose: 0.5 mg Route: Oral Frequency: 2 times daily PRN for anxiety  Dispense Quantity: 30 tablet Refills: 0        Sig: Take 1 tablet (0.5 mg total) by mouth 2 (two) times daily as needed for anxiety.

## 2021-10-05 NOTE — Telephone Encounter (Signed)
Requested medication (s) are due for refill today - yes  Requested medication (s) are on the active medication list -yes  Future visit scheduled -no  Last refill: 08/21/21 #30  Notes to clinic: non delegated Rx  Requested Prescriptions  Pending Prescriptions Disp Refills   LORazepam (ATIVAN) 0.5 MG tablet 30 tablet 0    Sig: Take 1 tablet (0.5 mg total) by mouth 2 (two) times daily as needed for anxiety.     Not Delegated - Psychiatry: Anxiolytics/Hypnotics 2 Failed - 10/05/2021  2:44 PM      Failed - This refill cannot be delegated      Failed - Urine Drug Screen completed in last 360 days      Passed - Patient is not pregnant      Passed - Valid encounter within last 6 months    Recent Outpatient Visits           5 months ago Routine general medical examination at a health care facility   [place], Cammie Mcgee, MD   1 year ago Pure hypercholesterolemia   [place], Cammie Mcgee, MD   2 years ago Prostate cancer screening   [place] [facility] Cammie Mcgee, MD   3 years ago [place] Susy Frizzle, MD   3 years ago Seasonal allergic rhinitis due to pollen   [place], Cammie Mcgee, MD                  Requested Prescriptions  Pending Prescriptions Disp Refills   LORazepam (ATIVAN) 0.5 MG tablet 30 tablet 0    Sig: Take 1 tablet (0.5 mg total) by mouth 2 (two) times daily as needed for anxiety.     Not Delegated - Psychiatry: Anxiolytics/Hypnotics 2 Failed - 10/05/2021  2:44 PM      Failed - This refill cannot be delegated      Failed - Urine Drug Screen completed in last 360 days      Passed - Patient is not pregnant      Passed - Valid encounter within last 6 months    Recent Outpatient Visits           5 months ago Routine general medical examination at a health care facility   [place], Cammie Mcgee, MD   1 year  ago Pure hypercholesterolemia   [place] [facility], Cammie Mcgee, MD   2 years ago Prostate cancer screening   [place] Susy Frizzle, MD   3 years ago Vertigo   [place] Susy Frizzle, MD   3 years ago Seasonal allergic rhinitis due to pollen   [place], Cammie Mcgee, MD

## 2021-10-14 MED ORDER — LORAZEPAM 0.5 MG PO TABS: 0.5000 mg | ORAL_TABLET | Freq: Two times a day (BID) | ORAL | 0 refills | Status: DC | PRN

## 2021-12-17 ENCOUNTER — Other Ambulatory Visit: Payer: Self-pay | Admitting: Family Medicine

## 2021-12-17 ENCOUNTER — Telehealth: Payer: Self-pay

## 2021-12-17 MED ORDER — LORAZEPAM 0.5 MG PO TABS: 0.5000 mg | ORAL_TABLET | Freq: Two times a day (BID) | ORAL | 0 refills | Status: DC | PRN

## 2021-12-17 NOTE — Telephone Encounter (Signed)
Pharmacy faxed a refill request for LORazepam (ATIVAN) 0.5 MG tablet [[id number]]    Order Details Dose: 0.5 mg Route: Oral Frequency: 2 times daily PRN for anxiety  Dispense Quantity: 30      LOV: 04/04/20

## 2021-12-19 ENCOUNTER — Other Ambulatory Visit: Payer: Self-pay | Admitting: Family Medicine

## 2021-12-19 NOTE — Telephone Encounter (Signed)
Requested Prescriptions  Pending Prescriptions Disp Refills  . celecoxib (CELEBREX) 200 MG capsule [Pharmacy Med Name: CELECOXIB 200 MG CAPSULE] 30 capsule 0    Sig: TAKE ONE CAPSULE BY MOUTH DAILY     Analgesics:  COX2 Inhibitors Failed - 12/19/2021  6:25 AM      Failed - Manual Review: Labs are only required if the patient has taken medication for more than 8 weeks.      Passed - HGB in normal range and within 360 days    Hemoglobin  Date Value Ref Range Status  04/12/2021 16.9 13.2 - 17.1 g/dL Final         Passed - Cr in normal range and within 360 days    Creat  Date Value Ref Range Status  04/12/2021 1.06 0.70 - 1.22 mg/dL Final         Passed - HCT in normal range and within 360 days    HCT  Date Value Ref Range Status  04/12/2021 50.0 38.5 - 50.0 % Final         Passed - AST in normal range and within 360 days    AST  Date Value Ref Range Status  04/12/2021 26 10 - 35 U/L Final         Passed - ALT in normal range and within 360 days    ALT  Date Value Ref Range Status  04/12/2021 26 9 - 46 U/L Final         Passed - eGFR is 30 or above and within 360 days    GFR, Est African American  Date Value Ref Range Status  04/04/2020 68 > OR = 60 mL/min/1.35m Final   GFR, Est Non African American  Date Value Ref Range Status  04/04/2020 58 (L) > OR = 60 mL/min/1.767mFinal   GFR  Date Value Ref Range Status  10/14/2013 78.81 >60.00 mL/min Final   eGFR  Date Value Ref Range Status  04/12/2021 71 > OR = 60 mL/min/1.7337minal    Comment:    The eGFR is based on the CKD-EPI 2021 equation. To calculate  the new eGFR from a previous Creatinine or Cystatin C result, go to https://www.kidney.org/professionals/ kdoqi/gfr%5Fcalculator          Passed - Patient is not pregnant      Passed - Valid encounter within last 12 months    Recent Outpatient Visits          8 months ago Routine general medical examination at a health care facility   [facility]   1 year ago Pure hypercholesterolemia   [facility]   2 years ago Prostate cancer screening   [facility]   3 years ago Vertigo   [facility]   3 years ago Seasonal allergic rhinitis due to pollen   [facility] [facility] [place]

## 2022-01-24 ENCOUNTER — Other Ambulatory Visit: Payer: Self-pay

## 2022-01-24 DIAGNOSIS — M1990 Unspecified osteoarthritis, unspecified site: Secondary | ICD-10-CM

## 2022-01-24 MED ORDER — CELECOXIB 200 MG PO CAPS: 200.0000 mg | ORAL_CAPSULE | Freq: Every day | ORAL | 1 refills | Status: DC

## 2022-03-04 ENCOUNTER — Other Ambulatory Visit: Payer: Self-pay | Admitting: Family Medicine

## 2022-03-04 ENCOUNTER — Telehealth: Payer: Self-pay

## 2022-03-04 MED ORDER — LORAZEPAM 0.5 MG PO TABS: 0.5000 mg | ORAL_TABLET | Freq: Two times a day (BID) | ORAL | 0 refills | Status: DC | PRN

## 2022-03-04 NOTE — Telephone Encounter (Signed)
Pt called in to request a refill of this med   LORazepam (ATIVAN) 0.5 MG tablet [[id number]]    Order Details Dose: 0.5 mg Route: Oral Frequency: 2 times daily PRN for anxiety  Dispense Quantity: 30 tablet Refills: 0        Sig: Take 1 tablet (0.5 mg total) by mouth 2 (two) times daily as needed for anxiety.       Start Date: 12/17/21 End Date: --  Written Date: 12/17/21 Expiration Date: 06/15/22  Original Order:  LORazepam (ATIVAN) 0.5 MG tablet [[id number]]    LOV: 04/12/21 (UPCOMING CPE 03/22/22)  PHARMACY: [facility] [id number] - [place], [place]

## 2022-03-06 DIAGNOSIS — L820 Inflamed seborrheic keratosis: Secondary | ICD-10-CM | POA: Diagnosis not present

## 2022-03-06 DIAGNOSIS — L821 Other seborrheic keratosis: Secondary | ICD-10-CM | POA: Diagnosis not present

## 2022-03-06 DIAGNOSIS — D485 Neoplasm of uncertain behavior of skin: Secondary | ICD-10-CM | POA: Diagnosis not present

## 2022-03-06 DIAGNOSIS — C44629 Squamous cell carcinoma of skin of left upper limb, including shoulder: Secondary | ICD-10-CM | POA: Diagnosis not present

## 2022-03-06 DIAGNOSIS — L570 Actinic keratosis: Secondary | ICD-10-CM | POA: Diagnosis not present

## 2022-03-22 ENCOUNTER — Telehealth: Payer: Self-pay | Admitting: Family Medicine

## 2022-03-22 ENCOUNTER — Ambulatory Visit (INDEPENDENT_AMBULATORY_CARE_PROVIDER_SITE_OTHER): Payer: Medicare Other | Admitting: Family Medicine

## 2022-03-22 VITALS — BP 136/86 | HR 81 | Ht 74.0 in | Wt 180.2 lb

## 2022-03-22 DIAGNOSIS — R350 Frequency of micturition: Secondary | ICD-10-CM

## 2022-03-22 DIAGNOSIS — E7800 Pure hypercholesterolemia, unspecified: Secondary | ICD-10-CM

## 2022-03-22 DIAGNOSIS — N401 Enlarged prostate with lower urinary tract symptoms: Secondary | ICD-10-CM

## 2022-03-22 DIAGNOSIS — Z125 Encounter for screening for malignant neoplasm of prostate: Secondary | ICD-10-CM

## 2022-03-22 DIAGNOSIS — Z0000 Encounter for general adult medical examination without abnormal findings: Secondary | ICD-10-CM | POA: Diagnosis not present

## 2022-03-22 DIAGNOSIS — R5382 Chronic fatigue, unspecified: Secondary | ICD-10-CM

## 2022-03-22 DIAGNOSIS — I10 Essential (primary) hypertension: Secondary | ICD-10-CM

## 2022-03-22 NOTE — Progress Notes (Signed)
Subjective:    Patient ID: Dave Cox, male    DOB: Aug 24, 1940, 81 y.o.   MRN: [id number]  HPI  Patient is here today for complete physical exam.  His wife died in 2019-07-19 from supranuclear palsy.  Patient reports progressive fatigue.  He denies any chest pain.  He denies any shortness of breath.  He denies any dyspnea on exertion.  He denies any headaches or memory loss.  He denies any falls.  He is active 4 days a week exercising in the gym.  He denies any dyspnea on exertion.  He denies any melena or hematochezia.  He denies any abdominal pain.  He denies any depression.  He has had his flu shot.  He has not had his most recent COVID shot. Immunization History  Administered Date(s) Administered   Fluad Quad(high Dose 65+) 02/08/2019, 04/04/2020, 04/12/2021   Influenza Split 03/04/2011   Influenza, High Dose Seasonal PF 02/17/2017, 03/19/2018   Influenza,inj,Quad PF,6+ Mos 03/08/2013, 03/13/2015, 04/11/2016   Influenza-Unspecified 02/04/2003, 02/24/2009, 02/28/2010, 03/04/2011, 03/05/2012, 03/09/2012, 02/23/2014   Pneumococcal Conjugate-13 03/10/2014   Pneumococcal Polysaccharide-23 03/04/2011   Td 12/29/2007   Zoster Recombinat (Shingrix) 06/15/2019    Past Medical History:  Diagnosis Date   Abnormal EKG    Anxiety    CAD (coronary artery disease)    Cervical osteoarthritis    COPD (chronic obstructive pulmonary disease) (HCC)    GERD (gastroesophageal reflux disease)    HTN (hypertension)    Hyperlipidemia    Melanoma ([place])    eye   Nephrolithiasis    Past Surgical History:  Procedure Laterality Date   BASAL CELL CARCINOMA EXCISION     CARDIAC CATHETERIZATION     HERNIA REPAIR     KIDNEY STONE SURGERY     LUMBAR DISC SURGERY     MELANOMA EXCISION     eye   STRABISMUS SURGERY Bilateral 08/23/2016   Procedure: REPAIR STRABISMUS;  Surgeon: [name], MD;  Location: [place];  Service: Ophthalmology;  Laterality: Bilateral;   STRABISMUS SURGERY Left  06/06/2017   Procedure: STRABISMUS REPAIR;  Surgeon: [name], MD;  Location: [place];  Service: Ophthalmology;  Laterality: Left;   TONSILLECTOMY     Current Outpatient Medications on File Prior to Visit  Medication Sig Dispense Refill   ascorbic acid (VITAMIN C) 500 MG tablet Take by mouth.     aspirin EC 81 MG tablet Take 81 mg by mouth daily.     celecoxib (CELEBREX) 200 MG capsule Take 1 capsule (200 mg total) by mouth daily. 30 capsule 1   EPINEPHrine 0.3 mg/0.3 mL IJ SOAJ injection Inject 0.3 mLs (0.3 mg total) into the muscle as needed. 2 Device 3   hydrOXYzine (ATARAX/VISTARIL) 25 MG tablet Take 1 tablet (25 mg total) by mouth 3 (three) times daily as needed. 30 tablet 0   LORATADINE PO Take 10 mg by mouth as needed.     LORazepam (ATIVAN) 0.5 MG tablet Take 1 tablet (0.5 mg total) by mouth 2 (two) times daily as needed for anxiety. 30 tablet 0   losartan (COZAAR) 100 MG tablet TAKE ONE TABLET BY MOUTH DAILY 90 tablet 3   mometasone (NASONEX) 50 MCG/ACT nasal spray Place 2 sprays into the nose daily. 17 g 3   NEXIUM 40 MG packet Take 40 mg by mouth daily before breakfast. 90 each 3   rosuvastatin (CRESTOR) 20 MG tablet Take 1 tablet (20 mg total) by mouth daily. 90 tablet  3   triamcinolone cream (KENALOG) 0.1 % apply topically twice daily FOR 14 DAYS  0   No current facility-administered medications on file prior to visit.   Allergies  Allergen Reactions   Bee Venom    Morphine Sulfate Other (See Comments)   Social History   Socioeconomic History   Marital status: Widowed    Spouse name: Not on file   Number of children: Not on file   Years of education: Not on file   Highest education level: Not on file  Occupational History   Not on file  Tobacco Use   Smoking status: Never   Smokeless tobacco: Never  Substance and Sexual Activity   Alcohol use: No   Drug use: No   Sexual activity: Not on file  Other Topics Concern   Not on file  Social  History Narrative   Not on file   Social Determinants of Health   Financial Resource Strain: Not on file  Food Insecurity: Not on file  Transportation Needs: Not on file  Physical Activity: Not on file  Stress: Not on file  Social Connections: Not on file  Intimate Partner Violence: Not on file   Family History  Problem Relation Age of Onset   Prostate cancer Father        mets   Arthritis Father    Hypertension Father    Arthritis Mother    Heart disease Mother    Hypertension Mother    Stroke Mother    Lung cancer Brother        was a smoker     Review of Systems  All other systems reviewed and are negative.      Objective:   Physical Exam Vitals reviewed.  Constitutional:      General: He is not in acute distress.    Appearance: He is well-developed. He is not diaphoretic.  HENT:     Head: Normocephalic and atraumatic.     Right Ear: External ear normal.     Left Ear: External ear normal.     Nose: Nose normal.     Mouth/Throat:     Pharynx: No oropharyngeal exudate.  Eyes:     General: No scleral icterus.       Right eye: No discharge.        Left eye: No discharge.     Conjunctiva/sclera: Conjunctivae normal.     Pupils: Pupils are equal, round, and reactive to light.  Neck:     Thyroid: No thyromegaly.     Vascular: No JVD.     Trachea: No tracheal deviation.  Cardiovascular:     Rate and Rhythm: Normal rate and regular rhythm.     Heart sounds: Normal heart sounds. No murmur heard.    No friction rub. No gallop.  Pulmonary:     Effort: Pulmonary effort is normal. No respiratory distress.     Breath sounds: Normal breath sounds. No stridor. No wheezing or rales.  Chest:     Chest wall: No tenderness.  Abdominal:     General: Bowel sounds are normal. There is no distension.     Palpations: Abdomen is soft. There is no mass.     Tenderness: There is no abdominal tenderness. There is no guarding or rebound.  Musculoskeletal:        General: No  tenderness. Normal range of motion.     Cervical back: Normal range of motion and neck supple.  Lymphadenopathy:     Cervical:  No cervical adenopathy.  Skin:    General: Skin is warm.     Coloration: Skin is not pale.     Findings: No erythema or rash.  Neurological:     Mental Status: He is alert and oriented to person, place, and time.     Cranial Nerves: No cranial nerve deficit.     Motor: No abnormal muscle tone.     Coordination: Coordination normal.     Deep Tendon Reflexes: Reflexes are normal and symmetric.  Psychiatric:        Behavior: Behavior normal.        Thought Content: Thought content normal.        Judgment: Judgment normal.           Assessment & Plan:  Routine general medical examination at a health care facility - Plan: CBC with Differential/Platelet, Lipid panel, COMPLETE METABOLIC PANEL WITH GFR  Pure hypercholesterolemia - Plan: CBC with Differential/Platelet, Lipid panel, COMPLETE METABOLIC PANEL WITH GFR  Benign essential HTN - Plan: CBC with Differential/Platelet, Lipid panel, COMPLETE METABOLIC PANEL WITH GFR  Chronic fatigue - Plan: Testosterone Total,Free,Bio, Males, Vitamin B12, TSH  Prostate cancer screening  Benign prostatic hyperplasia with urinary frequency - Plan: PSA Patient's physical exam today is completely normal.  I will check a CBC a CMP and lipid panel due to his history of hypertension and hyperlipidemia.  Because of his fatigue I will check a testosterone level, B12, and TSH.  Because of his fatigue and his history of BPH I will also check a PSA to evaluate for potential prostate cancer.  I recommended a COVID booster.  Await the results of the lab work

## 2022-03-22 NOTE — Telephone Encounter (Signed)
Patient requested for today's lab results to be mailed to him.

## 2022-03-23 LAB — COMPLETE METABOLIC PANEL WITH GFR
AG Ratio: 1.7 (calc) (ref 1.0–2.5)
ALT: 21 U/L (ref 9–46)
AST: 27 U/L (ref 10–35)
Albumin: 4.8 g/dL (ref 3.6–5.1)
Alkaline phosphatase (APISO): 73 U/L (ref 35–144)
BUN: 23 mg/dL (ref 7–25)
CO2: 28 mmol/L (ref 20–32)
Calcium: 10.5 mg/dL — ABNORMAL HIGH (ref 8.6–10.3)
Chloride: 102 mmol/L (ref 98–110)
Creat: 1.04 mg/dL (ref 0.70–1.22)
Globulin: 2.9 g/dL (calc) (ref 1.9–3.7)
Glucose, Bld: 108 mg/dL — ABNORMAL HIGH (ref 65–99)
Potassium: 5.5 mmol/L — ABNORMAL HIGH (ref 3.5–5.3)
Sodium: 141 mmol/L (ref 135–146)
Total Bilirubin: 1.7 mg/dL — ABNORMAL HIGH (ref 0.2–1.2)
Total Protein: 7.7 g/dL (ref 6.1–8.1)
eGFR: 72 mL/min/{1.73_m2} (ref >=60)

## 2022-03-23 LAB — LIPID PANEL
Cholesterol: 265 mg/dL — ABNORMAL HIGH (ref <200)
HDL: 42 mg/dL (ref >=40)
LDL Cholesterol (Calc): 193 mg/dL (calc) — ABNORMAL HIGH
Non-HDL Cholesterol (Calc): 223 mg/dL (calc) — ABNORMAL HIGH (ref <130)
Total CHOL/HDL Ratio: 6.3 (calc) — ABNORMAL HIGH (ref <5.0)
Triglycerides: 144 mg/dL (ref <150)

## 2022-03-23 LAB — CBC WITH DIFFERENTIAL/PLATELET
Absolute Monocytes: 800 cells/uL (ref 200–950)
Basophils Absolute: 50 cells/uL (ref 0–200)
Basophils Relative: 0.8 %
Eosinophils Absolute: 360 cells/uL (ref 15–500)
Eosinophils Relative: 5.8 %
HCT: 50 % (ref 38.5–50.0)
Hemoglobin: 17.3 g/dL — ABNORMAL HIGH (ref 13.2–17.1)
Lymphs Abs: 2195 cells/uL (ref 850–3900)
MCH: 31.5 pg (ref 27.0–33.0)
MCHC: 34.6 g/dL (ref 32.0–36.0)
MCV: 91.1 fL (ref 80.0–100.0)
MPV: 12.3 fL (ref 7.5–12.5)
Monocytes Relative: 12.9 %
Neutro Abs: 2796 cells/uL (ref 1500–7800)
Neutrophils Relative %: 45.1 %
Platelets: 157 10*3/uL (ref 140–400)
RBC: 5.49 10*6/uL (ref 4.20–5.80)
RDW: 11.7 % (ref 11.0–15.0)
Total Lymphocyte: 35.4 %
WBC: 6.2 10*3/uL (ref 3.8–10.8)

## 2022-03-23 LAB — TESTOSTERONE TOTAL,FREE,BIO, MALES
Albumin: 4.8 g/dL (ref 3.6–5.1)
Sex Hormone Binding: 66 nmol/L (ref 22–77)
Testosterone, Bioavailable: 126 ng/dL (ref 15.0–150.0)
Testosterone, Free: 57.6 pg/mL (ref 6.0–73.0)
Testosterone: 760 ng/dL (ref 250–827)

## 2022-03-23 LAB — PSA: PSA: 1.94 ng/mL (ref <=4.00)

## 2022-03-23 LAB — VITAMIN B12: Vitamin B-12: 481 pg/mL (ref 200–1100)

## 2022-03-23 LAB — TSH: TSH: 2.04 mIU/L (ref 0.40–4.50)

## 2022-03-25 ENCOUNTER — Other Ambulatory Visit: Payer: Self-pay | Admitting: Family Medicine

## 2022-03-25 MED ORDER — ROSUVASTATIN CALCIUM 20 MG PO TABS: 40.0000 mg | ORAL_TABLET | Freq: Every day | ORAL | 3 refills | Status: DC

## 2022-03-25 NOTE — Progress Notes (Signed)
Please let patient know to increase his Crestor to '40mg'$  daily and repeat lipids in 3-4 months.

## 2022-03-25 NOTE — Telephone Encounter (Signed)
Pt called to regarding med: Crestor ? Pt didn't understand why he was supposed to take this med again, since he has been off of it  Told pt that due to his labs/cholesterol, per provider recommendations. Pt voiced understanding, and stated that he has been having a little too much chocolate float/shake.  Per Pt stated that he will not take the meds for now will cut down on the ice cream and agrees to re-check lipids in 3 month.

## 2022-03-27 ENCOUNTER — Telehealth: Payer: Self-pay | Admitting: Family Medicine

## 2022-03-27 ENCOUNTER — Other Ambulatory Visit: Payer: Self-pay

## 2022-03-27 DIAGNOSIS — M1990 Unspecified osteoarthritis, unspecified site: Secondary | ICD-10-CM

## 2022-03-27 MED ORDER — CELECOXIB 200 MG PO CAPS: 200.0000 mg | ORAL_CAPSULE | Freq: Every day | ORAL | 3 refills | Status: DC

## 2022-03-27 NOTE — Telephone Encounter (Signed)
Patient called to report he will take the cholesterol medication and modify his diet.

## 2022-03-27 NOTE — Telephone Encounter (Signed)
Patient called to check on refill request for   celecoxib (CELEBREX) 200 MG capsule   Pharmacy confirmed as  [id number] - [address], [address], [place] [address] Phone: [phone]  Fax: [phone]   Please advise at [phone].

## 2022-04-22 ENCOUNTER — Other Ambulatory Visit: Payer: Self-pay | Admitting: Family Medicine

## 2022-05-24 ENCOUNTER — Other Ambulatory Visit: Payer: Self-pay | Admitting: Family Medicine

## 2022-05-24 DIAGNOSIS — M1990 Unspecified osteoarthritis, unspecified site: Secondary | ICD-10-CM

## 2022-06-03 ENCOUNTER — Other Ambulatory Visit: Payer: Self-pay

## 2022-07-24 ENCOUNTER — Other Ambulatory Visit: Payer: Self-pay | Admitting: Family Medicine

## 2022-09-23 ENCOUNTER — Other Ambulatory Visit: Payer: Self-pay | Admitting: Family Medicine

## 2022-09-24 NOTE — Telephone Encounter (Signed)
Requested medication (s) are due for refill today - yes  Requested medication (s) are on the active medication list -yes  Future visit scheduled -no  Last refill: 07/25/22 #30 1RF  Notes to clinic: non delegated  Rx  Requested Prescriptions  Pending Prescriptions Disp Refills   LORazepam (ATIVAN) 0.5 MG tablet [Pharmacy Med Name: LORazepam 0.5 MG TABLET] 30 tablet     Sig: TAKE ONE TABLET BY MOUTH TWICE A DAY AS NEEDED FOR ANXIETY     Not Delegated - Psychiatry: Anxiolytics/Hypnotics 2 Failed - 09/23/2022  5:07 PM      Failed - This refill cannot be delegated      Failed - Urine Drug Screen completed in last 360 days      Failed - Valid encounter within last 6 months    Recent Outpatient Visits           1 year ago Routine general medical examination at a health care facility   [facility] Medicine Pickard, Priscille Heidelberg, MD   2 years ago Pure hypercholesterolemia   [facility] Tanya Nones, Priscille Heidelberg, MD   3 years ago Prostate cancer screening   [facility] Tanya Nones, Priscille Heidelberg, MD   4 years ago Vertigo   [facility] Donita Brooks, MD   4 years ago Seasonal allergic rhinitis due to pollen   [facility] Pickard, Priscille Heidelberg, MD              Passed - Patient is not pregnant         Requested Prescriptions  Pending Prescriptions Disp Refills   LORazepam (ATIVAN) 0.5 MG tablet [Pharmacy Med Name: LORazepam 0.5 MG TABLET] 30 tablet     Sig: TAKE ONE TABLET BY MOUTH TWICE A DAY AS NEEDED FOR ANXIETY     Not Delegated - Psychiatry: Anxiolytics/Hypnotics 2 Failed - 09/23/2022  5:07 PM      Failed - This refill cannot be delegated      Failed - Urine Drug Screen completed in last 360 days      Failed - Valid encounter within last 6 months    Recent Outpatient Visits           1 year ago Routine general medical examination at a health care facility   [facility] Medicine Pickard, Priscille Heidelberg, MD    2 years ago Pure hypercholesterolemia   [facility] Tanya Nones, Priscille Heidelberg, MD   3 years ago Prostate cancer screening   [facility] Tanya Nones Priscille Heidelberg, MD   4 years ago Vertigo   [facility] Donita Brooks, MD   4 years ago Seasonal allergic rhinitis due to pollen   [facility] Medicine Pickard, Priscille Heidelberg, MD              Passed - Patient is not pregnant

## 2022-10-01 ENCOUNTER — Other Ambulatory Visit: Payer: Self-pay

## 2022-10-01 ENCOUNTER — Telehealth: Payer: Self-pay | Admitting: Family Medicine

## 2022-10-01 DIAGNOSIS — I10 Essential (primary) hypertension: Secondary | ICD-10-CM

## 2022-10-01 MED ORDER — LOSARTAN POTASSIUM 100 MG PO TABS: 100.0000 mg | ORAL_TABLET | Freq: Every day | ORAL | 0 refills | Status: DC

## 2022-10-01 NOTE — Telephone Encounter (Signed)
Prescription Request  10/01/2022  LOV: 03/22/2022  What is the name of the medication or equipment? losartan (COZAAR) 100 MG tablet   Have you contacted your pharmacy to request a refill? Yes   Which pharmacy would you like this sent to?  [facility] [id number] [place], [place] - [address] [place] [address] Phone: [phone] Fax: [phone]    Patient notified that their request is being sent to the clinical staff for review and that they should receive a response within 2 business days.   Please advise at [facility] [phone]

## 2022-10-17 ENCOUNTER — Ambulatory Visit (INDEPENDENT_AMBULATORY_CARE_PROVIDER_SITE_OTHER): Payer: Medicare PPO | Admitting: Family Medicine

## 2022-10-17 ENCOUNTER — Encounter: Payer: Self-pay | Admitting: Family Medicine

## 2022-10-17 VITALS — BP 162/102 | HR 57 | Temp 97.9°F | Ht 74.0 in | Wt 177.2 lb

## 2022-10-17 DIAGNOSIS — E7800 Pure hypercholesterolemia, unspecified: Secondary | ICD-10-CM | POA: Diagnosis not present

## 2022-10-17 DIAGNOSIS — C44629 Squamous cell carcinoma of skin of left upper limb, including shoulder: Secondary | ICD-10-CM | POA: Insufficient documentation

## 2022-10-17 DIAGNOSIS — I10 Essential (primary) hypertension: Secondary | ICD-10-CM

## 2022-10-17 NOTE — Progress Notes (Signed)
Subjective:    Patient ID: Dave Cox, male    DOB: 01-Mar-1941, 82 y.o.   MRN: [id number]  HPI Patient is a very pleasant 82 year old Caucasian gentleman with a history of hypertension as well as hyperlipidemia.  At his physical exam in November, his cholesterol was quite high so we increased his Crestor to 40 mg a day.  He is here today to recheck that however his blood pressure is markedly elevated with a systolic blood pressure of 160 and diastolic blood pressure greater than 100.  I verified this personally.  The patient has been monitoring her at home and states it is never this high on his cuff at home.  Previously has been well-controlled here.  He denies any chest pain shortness of breath or dyspnea on exertion   Past Medical History:  Diagnosis Date   Abnormal EKG    Anxiety    CAD (coronary artery disease)    Cervical osteoarthritis    COPD (chronic obstructive pulmonary disease) (HCC)    GERD (gastroesophageal reflux disease)    HTN (hypertension)    Hyperlipidemia    Melanoma (HCC)    eye   Nephrolithiasis    Past Surgical History:  Procedure Laterality Date   BASAL CELL CARCINOMA EXCISION     CARDIAC CATHETERIZATION     HERNIA REPAIR     KIDNEY STONE SURGERY     LUMBAR DISC SURGERY     MELANOMA EXCISION     eye   STRABISMUS SURGERY Bilateral 08/23/2016   Procedure: REPAIR STRABISMUS;  Surgeon: Verne Carrow, MD;  Location: [facility];  Service: Ophthalmology;  Laterality: Bilateral;   STRABISMUS SURGERY Left 06/06/2017   Procedure: STRABISMUS REPAIR;  Surgeon: Verne Carrow, MD;  Location: [facility];  Service: Ophthalmology;  Laterality: Left;   TONSILLECTOMY     Current Outpatient Medications on File Prior to Visit  Medication Sig Dispense Refill   ascorbic acid (VITAMIN C) 500 MG tablet Take by mouth.     celecoxib (CELEBREX) 200 MG capsule TAKE 1 CAPSULE BY MOUTH DAILY 30 capsule 3   LORazepam (ATIVAN) 0.5 MG tablet TAKE ONE  TABLET BY MOUTH TWICE A DAY AS NEEDED FOR ANXIETY 30 tablet 0   losartan (COZAAR) 100 MG tablet Take 1 tablet (100 mg total) by mouth daily. 90 tablet 0   NEXIUM 40 MG packet Take 40 mg by mouth daily before breakfast. 90 each 3   rosuvastatin (CRESTOR) 20 MG tablet Take 2 tablets (40 mg total) by mouth daily. 90 tablet 3   triamcinolone cream (KENALOG) 0.1 % apply topically twice daily FOR 14 DAYS  0   No current facility-administered medications on file prior to visit.   Allergies  Allergen Reactions   Bee Venom    Morphine Sulfate Other (See Comments)   Social History   Socioeconomic History   Marital status: Widowed    Spouse name: Not on file   Number of children: Not on file   Years of education: Not on file   Highest education level: Associate degree: occupational, Scientist, product/process development, or vocational program  Occupational History   Not on file  Tobacco Use   Smoking status: Never   Smokeless tobacco: Never  Substance and Sexual Activity   Alcohol use: No   Drug use: No   Sexual activity: Not on file  Other Topics Concern   Not on file  Social History Narrative   Not on file   Social Determinants of Health  Financial Resource Strain: Low Risk  (10/16/2022)   Overall Financial Resource Strain (CARDIA)    Difficulty of Paying Living Expenses: Not hard at all  Food Insecurity: No Food Insecurity (10/16/2022)   Hunger Vital Sign    Worried About Running Out of Food in the Last Year: Never true    Ran Out of Food in the Last Year: Never true  Transportation Needs: No Transportation Needs (10/16/2022)   PRAPARE - Administrator, Civil Service (Medical): No    Lack of Transportation (Non-Medical): No  Physical Activity: Sufficiently Active (10/16/2022)   Exercise Vital Sign    Days of Exercise per Week: 5 days    Minutes of Exercise per Session: 60 min  Stress: Stress Concern Present (10/17/2022)   [employer] of Occupational Health - Occupational Stress  Questionnaire    Feeling of Stress : To some extent  Social Connections: Moderately Integrated (10/16/2022)   Social Connection and Isolation Panel [NHANES]    Frequency of Communication with Friends and Family: More than three times a week    Frequency of Social Gatherings with Friends and Family: More than three times a week    Attends Religious Services: More than 4 times per year    Active Member of [employer] or Organizations: Yes    Attends Banker Meetings: More than 4 times per year    Marital Status: Widowed  Catering manager Violence: Not on file   Family History  Problem Relation Age of Onset   Prostate cancer Father        mets   Arthritis Father    Hypertension Father    Arthritis Mother    Heart disease Mother    Hypertension Mother    Stroke Mother    Lung cancer Brother        was a smoker     Review of Systems  All other systems reviewed and are negative.      Objective:   Physical Exam Vitals reviewed.  Constitutional:      General: He is not in acute distress.    Appearance: He is well-developed. He is not diaphoretic.  HENT:     Head: Normocephalic and atraumatic.     Right Ear: External ear normal.     Left Ear: External ear normal.     Nose: Nose normal.     Mouth/Throat:     Pharynx: No oropharyngeal exudate.  Eyes:     General: No scleral icterus.       Right eye: No discharge.        Left eye: No discharge.     Conjunctiva/sclera: Conjunctivae normal.     Pupils: Pupils are equal, round, and reactive to light.  Neck:     Thyroid: No thyromegaly.     Vascular: No JVD.     Trachea: No tracheal deviation.  Cardiovascular:     Rate and Rhythm: Normal rate and regular rhythm.     Heart sounds: Normal heart sounds. No murmur heard.    No friction rub. No gallop.  Pulmonary:     Effort: Pulmonary effort is normal. No respiratory distress.     Breath sounds: Normal breath sounds. No stridor. No wheezing or rales.  Chest:     Chest  wall: No tenderness.  Abdominal:     General: Bowel sounds are normal. There is no distension.     Palpations: Abdomen is soft. There is no mass.     Tenderness: There  is no abdominal tenderness. There is no guarding or rebound.  Musculoskeletal:        General: No tenderness. Normal range of motion.     Cervical back: Normal range of motion and neck supple.  Lymphadenopathy:     Cervical: No cervical adenopathy.  Skin:    General: Skin is warm.     Coloration: Skin is not pale.     Findings: No erythema or rash.  Neurological:     Mental Status: He is alert and oriented to person, place, and time.     Cranial Nerves: No cranial nerve deficit.     Motor: No abnormal muscle tone.     Coordination: Coordination normal.     Deep Tendon Reflexes: Reflexes are normal and symmetric.  Psychiatric:        Behavior: Behavior normal.        Thought Content: Thought content normal.        Judgment: Judgment normal.          Assessment & Plan:  Pure hypercholesterolemia - Plan: CBC with Differential/Platelet, Lipid panel, COMPLETE METABOLIC PANEL WITH GFR  Benign essential HTN Patient's blood pressure today is much higher than his baseline.  We checked it 3 separate times and it remained 160/100.  Patient would like to check his blood pressure at home over the next 3 to 4 days and report the values for me.  If consistently greater than 140/90 we will add hydrochlorothiazide to losartan.  Meanwhile check CMP and lipid panel

## 2022-10-18 LAB — COMPLETE METABOLIC PANEL WITH GFR
AG Ratio: 2.3 (calc) (ref 1.0–2.5)
ALT: 31 U/L (ref 9–46)
AST: 30 U/L (ref 10–35)
Albumin: 4.6 g/dL (ref 3.6–5.1)
Alkaline phosphatase (APISO): 60 U/L (ref 35–144)
BUN: 17 mg/dL (ref 7–25)
CO2: 29 mmol/L (ref 20–32)
Calcium: 9.7 mg/dL (ref 8.6–10.3)
Chloride: 104 mmol/L (ref 98–110)
Creat: 0.99 mg/dL (ref 0.70–1.22)
Globulin: 2 g/dL (calc) (ref 1.9–3.7)
Glucose, Bld: 86 mg/dL (ref 65–99)
Potassium: 4.9 mmol/L (ref 3.5–5.3)
Sodium: 139 mmol/L (ref 135–146)
Total Bilirubin: 1.5 mg/dL — ABNORMAL HIGH (ref 0.2–1.2)
Total Protein: 6.6 g/dL (ref 6.1–8.1)
eGFR: 77 mL/min/{1.73_m2} (ref >=60)

## 2022-10-18 LAB — CBC WITH DIFFERENTIAL/PLATELET
Absolute Monocytes: 787 cells/uL (ref 200–950)
Basophils Absolute: 48 cells/uL (ref 0–200)
Basophils Relative: 0.7 %
Eosinophils Absolute: 290 cells/uL (ref 15–500)
Eosinophils Relative: 4.2 %
HCT: 45.7 % (ref 38.5–50.0)
Hemoglobin: 15.5 g/dL (ref 13.2–17.1)
Lymphs Abs: 2450 cells/uL (ref 850–3900)
MCH: 31 pg (ref 27.0–33.0)
MCHC: 33.9 g/dL (ref 32.0–36.0)
MCV: 91.4 fL (ref 80.0–100.0)
MPV: 12.3 fL (ref 7.5–12.5)
Monocytes Relative: 11.4 %
Neutro Abs: 3326 cells/uL (ref 1500–7800)
Neutrophils Relative %: 48.2 %
Platelets: 134 10*3/uL — ABNORMAL LOW (ref 140–400)
RBC: 5 10*6/uL (ref 4.20–5.80)
RDW: 12.1 % (ref 11.0–15.0)
Total Lymphocyte: 35.5 %
WBC: 6.9 10*3/uL (ref 3.8–10.8)

## 2022-10-18 LAB — LIPID PANEL
Cholesterol: 111 mg/dL (ref <200)
HDL: 40 mg/dL (ref >=40)
LDL Cholesterol (Calc): 54 mg/dL (calc)
Non-HDL Cholesterol (Calc): 71 mg/dL (calc) (ref <130)
Total CHOL/HDL Ratio: 2.8 (calc) (ref <5.0)
Triglycerides: 87 mg/dL (ref <150)

## 2022-10-28 ENCOUNTER — Other Ambulatory Visit: Payer: Self-pay | Admitting: Family Medicine

## 2022-11-01 ENCOUNTER — Other Ambulatory Visit: Payer: Self-pay

## 2022-11-01 DIAGNOSIS — I2510 Atherosclerotic heart disease of native coronary artery without angina pectoris: Secondary | ICD-10-CM

## 2022-11-01 DIAGNOSIS — E785 Hyperlipidemia, unspecified: Secondary | ICD-10-CM

## 2022-11-01 MED ORDER — ROSUVASTATIN CALCIUM 40 MG PO TABS
40.0000 mg | ORAL_TABLET | Freq: Every day | ORAL | 1 refills | Status: DC
Start: 2022-11-01 — End: 2023-06-02

## 2022-11-26 ENCOUNTER — Other Ambulatory Visit: Payer: Self-pay | Admitting: Family Medicine

## 2022-11-26 DIAGNOSIS — M1990 Unspecified osteoarthritis, unspecified site: Secondary | ICD-10-CM

## 2022-12-09 ENCOUNTER — Encounter: Payer: Self-pay | Admitting: Family Medicine

## 2022-12-19 ENCOUNTER — Encounter: Payer: Self-pay | Admitting: Family Medicine

## 2022-12-19 ENCOUNTER — Other Ambulatory Visit: Payer: Self-pay | Admitting: Family Medicine

## 2022-12-19 MED ORDER — PREDNISONE 20 MG PO TABS: ORAL_TABLET | ORAL | 0 refills | Status: DC

## 2023-01-01 ENCOUNTER — Other Ambulatory Visit: Payer: Self-pay | Admitting: Family Medicine

## 2023-01-01 DIAGNOSIS — I10 Essential (primary) hypertension: Secondary | ICD-10-CM

## 2023-01-02 ENCOUNTER — Other Ambulatory Visit: Payer: Self-pay | Admitting: Family Medicine

## 2023-01-02 DIAGNOSIS — I10 Essential (primary) hypertension: Secondary | ICD-10-CM

## 2023-01-02 NOTE — Telephone Encounter (Signed)
Requested Prescriptions  Pending Prescriptions Disp Refills   losartan (COZAAR) 100 MG tablet [Pharmacy Med Name: LOSARTAN POTASSIUM 100 MG TAB] 90 tablet 0    Sig: TAKE 1 TABLET BY MOUTH DAILY     Cardiovascular:  Angiotensin Receptor Blockers Failed - 01/01/2023  9:31 AM      Failed - Last BP in normal range    BP Readings from Last 1 Encounters:  10/17/22 (!) 162/102         Failed - Valid encounter within last 6 months    Recent Outpatient Visits           1 year ago Routine general medical examination at a health care facility   [facility] Medicine Pickard, Priscille Heidelberg, MD   2 years ago Pure hypercholesterolemia   [facility] Tanya Nones, Priscille Heidelberg, MD   3 years ago Prostate cancer screening   [facility] Medicine Tanya Nones, Priscille Heidelberg, MD   4 years ago Vertigo   [facility] Medicine Donita Brooks, MD   4 years ago Seasonal allergic rhinitis due to pollen   [facility] Medicine Pickard, Priscille Heidelberg, MD       Future Appointments             In 2 months Pickard, Priscille Heidelberg, MD [facility], [facility]            Passed - Cr in normal range and within 180 days    Creat  Date Value Ref Range Status  10/17/2022 0.99 0.70 - 1.22 mg/dL Final         Passed - K in normal range and within 180 days    Potassium  Date Value Ref Range Status  10/17/2022 4.9 3.5 - 5.3 mmol/L Final         Passed - Patient is not pregnant

## 2023-01-14 DIAGNOSIS — D0461 Carcinoma in situ of skin of right upper limb, including shoulder: Secondary | ICD-10-CM | POA: Diagnosis not present

## 2023-01-30 DIAGNOSIS — L089 Local infection of the skin and subcutaneous tissue, unspecified: Secondary | ICD-10-CM | POA: Diagnosis not present

## 2023-01-30 DIAGNOSIS — Z4802 Encounter for removal of sutures: Secondary | ICD-10-CM | POA: Diagnosis not present

## 2023-02-04 ENCOUNTER — Other Ambulatory Visit: Payer: Self-pay | Admitting: Family Medicine

## 2023-02-04 ENCOUNTER — Telehealth: Payer: Self-pay | Admitting: Family Medicine

## 2023-02-04 MED ORDER — LORAZEPAM 0.5 MG PO TABS: 0.5000 mg | ORAL_TABLET | Freq: Three times a day (TID) | ORAL | 2 refills | Status: DC | PRN

## 2023-02-04 NOTE — Telephone Encounter (Signed)
Prescription Request  02/04/2023  LOV: 10/17/2022  What is the name of the medication or equipment?   LORazepam (ATIVAN) 0.5 MG tablet  SIG: take one tablet twice a day **requesting a 30 day supply**  Have you contacted your pharmacy to request a refill? Yes   Which pharmacy would you like this sent to?  [facility] [id number] Ginette Otto, [place] - [address] [place] [address] Phone: [phone] Fax: [phone]    Patient notified that their request is being sent to the clinical staff for review and that they should receive a response within 2 business days.   Please advise patient at [phone].

## 2023-03-25 ENCOUNTER — Other Ambulatory Visit: Payer: Self-pay | Admitting: Family Medicine

## 2023-03-25 DIAGNOSIS — M1990 Unspecified osteoarthritis, unspecified site: Secondary | ICD-10-CM

## 2023-03-26 NOTE — Telephone Encounter (Signed)
Last OV 10/17/22 Requested Prescriptions  Pending Prescriptions Disp Refills   celecoxib (CELEBREX) 200 MG capsule [Pharmacy Med Name: CELECOXIB 200 MG CAPSULE] 90 capsule 0    Sig: TAKE 1 CAPSULE BY MOUTH DAILY     Analgesics:  COX2 Inhibitors Failed - 03/25/2023  6:25 AM      Failed - Manual Review: Labs are only required if the patient has taken medication for more than 8 weeks.      Failed - Valid encounter within last 12 months    Recent Outpatient Visits           1 year ago Routine general medical examination at a health care facility   [facility] Pickard, Priscille Heidelberg, MD   2 years ago Pure hypercholesterolemia   [facility] Tanya Nones, Priscille Heidelberg, MD   4 years ago Prostate cancer screening   [facility] Donita Brooks, MD   4 years ago Vertigo   [facility] Donita Brooks, MD   4 years ago Seasonal allergic rhinitis due to pollen   [facility] Pickard, Priscille Heidelberg, MD       Future Appointments             Tomorrow Pickard, Priscille Heidelberg, MD [facility]            Passed - HGB in normal range and within 360 days    Hemoglobin  Date Value Ref Range Status  10/17/2022 15.5 13.2 - 17.1 g/dL Final         Passed - Cr in normal range and within 360 days    Creat  Date Value Ref Range Status  10/17/2022 0.99 0.70 - 1.22 mg/dL Final         Passed - HCT in normal range and within 360 days    HCT  Date Value Ref Range Status  10/17/2022 45.7 38.5 - 50.0 % Final         Passed - AST in normal range and within 360 days    AST  Date Value Ref Range Status  10/17/2022 30 10 - 35 U/L Final         Passed - ALT in normal range and within 360 days    ALT  Date Value Ref Range Status  10/17/2022 31 9 - 46 U/L Final         Passed - eGFR is 30 or above and within 360 days    GFR, Est African American  Date Value Ref Range Status  04/04/2020 68 > OR =  60 mL/min/1.65m2 Final   GFR, Est Non African American  Date Value Ref Range Status  04/04/2020 58 (L) > OR = 60 mL/min/1.90m2 Final   GFR  Date Value Ref Range Status  10/14/2013 78.81 >60.00 mL/min Final   eGFR  Date Value Ref Range Status  10/17/2022 77 > OR = 60 mL/min/1.73m2 Final         Passed - Patient is not pregnant

## 2023-03-27 ENCOUNTER — Encounter: Payer: Self-pay | Admitting: Family Medicine

## 2023-03-27 ENCOUNTER — Ambulatory Visit (INDEPENDENT_AMBULATORY_CARE_PROVIDER_SITE_OTHER): Payer: Medicare PPO | Admitting: Family Medicine

## 2023-03-27 VITALS — BP 126/84 | HR 85 | Temp 98.2°F | Ht 74.0 in | Wt 175.0 lb

## 2023-03-27 DIAGNOSIS — Z125 Encounter for screening for malignant neoplasm of prostate: Secondary | ICD-10-CM

## 2023-03-27 DIAGNOSIS — E7800 Pure hypercholesterolemia, unspecified: Secondary | ICD-10-CM

## 2023-03-27 DIAGNOSIS — I10 Essential (primary) hypertension: Secondary | ICD-10-CM

## 2023-03-27 DIAGNOSIS — Z0000 Encounter for general adult medical examination without abnormal findings: Secondary | ICD-10-CM

## 2023-03-27 DIAGNOSIS — Z0001 Encounter for general adult medical examination with abnormal findings: Secondary | ICD-10-CM

## 2023-03-27 DIAGNOSIS — R350 Frequency of micturition: Secondary | ICD-10-CM

## 2023-03-27 DIAGNOSIS — N401 Enlarged prostate with lower urinary tract symptoms: Secondary | ICD-10-CM | POA: Diagnosis not present

## 2023-03-27 NOTE — Progress Notes (Signed)
Subjective:    Patient ID: Dave Cox, male    DOB: [birth year], 82 y.o.   MRN: [id number]  HPI  Patient is here today for complete physical exam.  His wife died in 04/01/20 from supranuclear palsy.  Patient continues to grieve her passing.  It is especially difficult this time the year.  He is due for the second shingles vaccine, COVID booster, and RSV shot, and a tetanus shot.  He declines these vaccinations today.  Due to his age he does not require colon cancer screening.  I recommended against prostate cancer screening given his age but he would still like to check a PSA.  Otherwise, the patient is doing quite well. Immunization History  Administered Date(s) Administered   Fluad Quad(high Dose 65+) 02/08/2019, 04/04/2020, 04/12/2021   Influenza Split 03/04/2011   Influenza, High Dose Seasonal PF 02/17/2017, 03/19/2018   Influenza,inj,Quad PF,6+ Mos 03/08/2013, 03/13/2015, 04/11/2016   Influenza-Unspecified 02/04/2003, 02/24/2009, 02/28/2010, 03/04/2011, 03/05/2012, 03/09/2012, 02/23/2014   Pneumococcal Conjugate-13 03/10/2014   Pneumococcal Polysaccharide-23 03/04/2011   Td 12/29/2007   Zoster Recombinant(Shingrix) 06/15/2019    Past Medical History:  Diagnosis Date   Abnormal EKG    Anxiety    CAD (coronary artery disease)    Cervical osteoarthritis    COPD (chronic obstructive pulmonary disease) (HCC)    GERD (gastroesophageal reflux disease)    HTN (hypertension)    Hyperlipidemia    Melanoma (HCC)    eye   Nephrolithiasis    Past Surgical History:  Procedure Laterality Date   BASAL CELL CARCINOMA EXCISION     CARDIAC CATHETERIZATION     HERNIA REPAIR     KIDNEY STONE SURGERY     LUMBAR DISC SURGERY     MELANOMA EXCISION     eye   STRABISMUS SURGERY Bilateral 08/23/2016   Procedure: REPAIR STRABISMUS;  Surgeon: Verne Carrow, MD;  Location: [facility];  Service: Ophthalmology;  Laterality: Bilateral;   STRABISMUS SURGERY Left 06/06/2017   Procedure:  STRABISMUS REPAIR;  Surgeon: Verne Carrow, MD;  Location: [facility];  Service: Ophthalmology;  Laterality: Left;   TONSILLECTOMY     Current Outpatient Medications on File Prior to Visit  Medication Sig Dispense Refill   acetaminophen (TYLENOL) 500 MG tablet Take 500 mg by mouth every 6 (six) hours as needed.     ascorbic acid (VITAMIN C) 500 MG tablet Take by mouth.     celecoxib (CELEBREX) 200 MG capsule TAKE 1 CAPSULE BY MOUTH DAILY 90 capsule 0   LORazepam (ATIVAN) 0.5 MG tablet Take 1 tablet (0.5 mg total) by mouth every 8 (eight) hours as needed for anxiety. 30 tablet 2   losartan (COZAAR) 100 MG tablet TAKE 1 TABLET BY MOUTH DAILY 90 tablet 0   rosuvastatin (CRESTOR) 40 MG tablet Take 1 tablet (40 mg total) by mouth daily. 90 tablet 1   triamcinolone cream (KENALOG) 0.1 % apply topically twice daily FOR 14 DAYS  0   No current facility-administered medications on file prior to visit.   Allergies  Allergen Reactions   Bee Venom    Morphine Sulfate Other (See Comments)   Social History   Socioeconomic History   Marital status: Widowed    Spouse name: Not on file   Number of children: Not on file   Years of education: Not on file   Highest education level: Associate degree: occupational, Scientist, product/process development, or vocational program  Occupational History   Not on file  Tobacco Use  Smoking status: Never   Smokeless tobacco: Never  Substance and Sexual Activity   Alcohol use: No   Drug use: No   Sexual activity: Not on file  Other Topics Concern   Not on file  Social History Narrative   Not on file   Social Determinants of Health   Financial Resource Strain: Low Risk  (10/16/2022)   Overall Financial Resource Strain (CARDIA)    Difficulty of Paying Living Expenses: Not hard at all  Food Insecurity: No Food Insecurity (10/16/2022)   Hunger Vital Sign    Worried About Running Out of Food in the Last Year: Never true    Ran Out of Food in the Last Year: Never  true  Transportation Needs: No Transportation Needs (10/16/2022)   PRAPARE - Administrator, Civil Service (Medical): No    Lack of Transportation (Non-Medical): No  Physical Activity: Sufficiently Active (10/16/2022)   Exercise Vital Sign    Days of Exercise per Week: 5 days    Minutes of Exercise per Session: 60 min  Stress: Stress Concern Present (10/17/2022)   [employer] of Occupational Health - Occupational Stress Questionnaire    Feeling of Stress : To some extent  Social Connections: Moderately Integrated (10/16/2022)   Social Connection and Isolation Panel [NHANES]    Frequency of Communication with Friends and Family: More than three times a week    Frequency of Social Gatherings with Friends and Family: More than three times a week    Attends Religious Services: More than 4 times per year    Active Member of [employer] or Organizations: Yes    Attends Banker Meetings: More than 4 times per year    Marital Status: Widowed  Catering manager Violence: Not on file   Family History  Problem Relation Age of Onset   Prostate cancer Father        mets   Arthritis Father    Hypertension Father    Arthritis Mother    Heart disease Mother    Hypertension Mother    Stroke Mother    Lung cancer Brother        was a smoker     Review of Systems  All other systems reviewed and are negative.      Objective:   Physical Exam Vitals reviewed.  Constitutional:      General: He is not in acute distress.    Appearance: He is well-developed. He is not diaphoretic.  HENT:     Head: Normocephalic and atraumatic.     Right Ear: External ear normal.     Left Ear: External ear normal.     Nose: Nose normal.     Mouth/Throat:     Pharynx: No oropharyngeal exudate.  Eyes:     General: No scleral icterus.       Right eye: No discharge.        Left eye: No discharge.     Conjunctiva/sclera: Conjunctivae normal.     Pupils: Pupils are equal, round, and  reactive to light.  Neck:     Thyroid: No thyromegaly.     Vascular: No JVD.     Trachea: No tracheal deviation.  Cardiovascular:     Rate and Rhythm: Normal rate and regular rhythm.     Heart sounds: Normal heart sounds. No murmur heard.    No friction rub. No gallop.  Pulmonary:     Effort: Pulmonary effort is normal. No respiratory distress.  Breath sounds: Normal breath sounds. No stridor. No wheezing or rales.  Chest:     Chest wall: No tenderness.  Abdominal:     General: Bowel sounds are normal. There is no distension.     Palpations: Abdomen is soft. There is no mass.     Tenderness: There is no abdominal tenderness. There is no guarding or rebound.  Musculoskeletal:        General: No tenderness. Normal range of motion.     Cervical back: Normal range of motion and neck supple.  Lymphadenopathy:     Cervical: No cervical adenopathy.  Skin:    General: Skin is warm.     Coloration: Skin is not pale.     Findings: No erythema or rash.  Neurological:     Mental Status: He is alert and oriented to person, place, and time.     Cranial Nerves: No cranial nerve deficit.     Motor: No abnormal muscle tone.     Coordination: Coordination normal.     Deep Tendon Reflexes: Reflexes are normal and symmetric.  Psychiatric:        Behavior: Behavior normal.        Thought Content: Thought content normal.        Judgment: Judgment normal.           Assessment & Plan:  Pure hypercholesterolemia  Benign essential HTN - Plan: CBC with Differential/Platelet, COMPLETE METABOLIC PANEL WITH GFR, Lipid panel  Prostate cancer screening - Plan: PSA  Benign prostatic hyperplasia with urinary frequency - Plan: PSA  General medical exam I recommended against a PSA but the patient would like to have 1 as a baseline.  Therefore I did order the PSA.  Colon cancer screening is not necessary.  I recommended the second dose of the shingles vaccine, COVID shot, and an RSV shot.  He  politely declined these.  His flu shot is up-to-date.  Blood pressure is excellent.  Physical exam is normal.  I will check a CBC a CMP and a lipid panel.

## 2023-03-28 LAB — COMPLETE METABOLIC PANEL WITH GFR
AG Ratio: 2.3 (calc) (ref 1.0–2.5)
ALT: 30 U/L (ref 9–46)
AST: 29 U/L (ref 10–35)
Albumin: 4.8 g/dL (ref 3.6–5.1)
Alkaline phosphatase (APISO): 64 U/L (ref 35–144)
BUN: 21 mg/dL (ref 7–25)
CO2: 29 mmol/L (ref 20–32)
Calcium: 9.6 mg/dL (ref 8.6–10.3)
Chloride: 101 mmol/L (ref 98–110)
Creat: 1.09 mg/dL (ref 0.70–1.22)
Globulin: 2.1 g/dL (ref 1.9–3.7)
Glucose, Bld: 91 mg/dL (ref 65–99)
Potassium: 4.8 mmol/L (ref 3.5–5.3)
Sodium: 140 mmol/L (ref 135–146)
Total Bilirubin: 1.6 mg/dL — ABNORMAL HIGH (ref 0.2–1.2)
Total Protein: 6.9 g/dL (ref 6.1–8.1)
eGFR: 68 mL/min/{1.73_m2} (ref >=60)

## 2023-03-28 LAB — CBC WITH DIFFERENTIAL/PLATELET
Absolute Lymphocytes: 2390 {cells}/uL (ref 850–3900)
Absolute Monocytes: 821 {cells}/uL (ref 200–950)
Basophils Absolute: 59 {cells}/uL (ref 0–200)
Basophils Relative: 0.8 %
Eosinophils Absolute: 333 {cells}/uL (ref 15–500)
Eosinophils Relative: 4.5 %
HCT: 49.7 % (ref 38.5–50.0)
Hemoglobin: 16.6 g/dL (ref 13.2–17.1)
MCH: 31.4 pg (ref 27.0–33.0)
MCHC: 33.4 g/dL (ref 32.0–36.0)
MCV: 94 fL (ref 80.0–100.0)
MPV: 12.4 fL (ref 7.5–12.5)
Monocytes Relative: 11.1 %
Neutro Abs: 3796 {cells}/uL (ref 1500–7800)
Neutrophils Relative %: 51.3 %
Platelets: 133 10*3/uL — ABNORMAL LOW (ref 140–400)
RBC: 5.29 10*6/uL (ref 4.20–5.80)
RDW: 11.6 % (ref 11.0–15.0)
Total Lymphocyte: 32.3 %
WBC: 7.4 10*3/uL (ref 3.8–10.8)

## 2023-03-28 LAB — LIPID PANEL
Cholesterol: 115 mg/dL (ref <200)
HDL: 40 mg/dL (ref >=40)
LDL Cholesterol (Calc): 57 mg/dL
Non-HDL Cholesterol (Calc): 75 mg/dL (ref <130)
Total CHOL/HDL Ratio: 2.9 (calc) (ref <5.0)
Triglycerides: 93 mg/dL (ref <150)

## 2023-03-28 LAB — PSA: PSA: 1.74 ng/mL (ref <=4.00)

## 2023-03-30 ENCOUNTER — Other Ambulatory Visit: Payer: Self-pay | Admitting: Family Medicine

## 2023-03-30 DIAGNOSIS — I10 Essential (primary) hypertension: Secondary | ICD-10-CM

## 2023-04-01 NOTE — Telephone Encounter (Signed)
Requested Prescriptions  Pending Prescriptions Disp Refills   losartan (COZAAR) 100 MG tablet [Pharmacy Med Name: LOSARTAN POTASSIUM 100 MG TAB] 90 tablet 1    Sig: TAKE 1 TABLET BY MOUTH DAILY     Cardiovascular:  Angiotensin Receptor Blockers Failed - 03/30/2023  6:55 AM      Failed - Valid encounter within last 6 months    Recent Outpatient Visits           1 year ago Routine general medical examination at a health care facility   [facility] Pickard, Priscille Heidelberg, MD   2 years ago Pure hypercholesterolemia   [facility] Tanya Nones, Priscille Heidelberg, MD   4 years ago Prostate cancer screening   [facility] Donita Brooks, MD   4 years ago Vertigo   [facility] Donita Brooks, MD   4 years ago Seasonal allergic rhinitis due to pollen   [facility] Pickard, Priscille Heidelberg, MD              Passed - Cr in normal range and within 180 days    Creat  Date Value Ref Range Status  03/27/2023 1.09 0.70 - 1.22 mg/dL Final         Passed - K in normal range and within 180 days    Potassium  Date Value Ref Range Status  03/27/2023 4.8 3.5 - 5.3 mmol/L Final         Passed - Patient is not pregnant      Passed - Last BP in normal range    BP Readings from Last 1 Encounters:  03/27/23 126/84

## 2023-04-11 ENCOUNTER — Telehealth: Payer: Self-pay | Admitting: Family Medicine

## 2023-04-11 NOTE — Telephone Encounter (Signed)
Copied from CRM [id number]. Topic: Medical Record Request - Records Request >> Apr 11, 2023 10:35 AM Dave Cox wrote: Reason for CRM: Patient requested a copy of his blood work results which is in [name] , patient has new request for a copy of the blood work results be mailed to his home address on file:  [address] [place] [address]

## 2023-04-21 ENCOUNTER — Other Ambulatory Visit: Payer: Self-pay | Admitting: Family Medicine

## 2023-06-02 ENCOUNTER — Other Ambulatory Visit: Payer: Self-pay | Admitting: Family Medicine

## 2023-06-02 ENCOUNTER — Other Ambulatory Visit: Payer: Self-pay

## 2023-06-02 DIAGNOSIS — E785 Hyperlipidemia, unspecified: Secondary | ICD-10-CM

## 2023-06-02 DIAGNOSIS — M1990 Unspecified osteoarthritis, unspecified site: Secondary | ICD-10-CM

## 2023-06-02 DIAGNOSIS — I2510 Atherosclerotic heart disease of native coronary artery without angina pectoris: Secondary | ICD-10-CM

## 2023-06-02 MED ORDER — CELECOXIB 200 MG PO CAPS: 200.0000 mg | ORAL_CAPSULE | Freq: Every day | ORAL | 1 refills | Status: DC

## 2023-06-02 MED ORDER — ROSUVASTATIN CALCIUM 40 MG PO TABS: 40.0000 mg | ORAL_TABLET | Freq: Every day | ORAL | 1 refills | Status: DC

## 2023-06-05 ENCOUNTER — Other Ambulatory Visit: Payer: Self-pay | Admitting: Family Medicine

## 2023-06-05 DIAGNOSIS — I2510 Atherosclerotic heart disease of native coronary artery without angina pectoris: Secondary | ICD-10-CM

## 2023-06-05 DIAGNOSIS — E785 Hyperlipidemia, unspecified: Secondary | ICD-10-CM

## 2023-09-18 ENCOUNTER — Other Ambulatory Visit: Payer: Self-pay | Admitting: Family Medicine

## 2023-09-22 NOTE — Telephone Encounter (Signed)
 Requested medication (s) are due for refill today: yes  Requested medication (s) are on the active medication list: yes  Last refill:  04/21/23 #30/3  Future visit scheduled: no  Notes to clinic:  Unable to refill per protocol, cannot delegate.      Requested Prescriptions  Pending Prescriptions Disp Refills   LORazepam  (ATIVAN ) 0.5 MG tablet [Pharmacy Med Name: LORAZEPAM  0.5 MG TABLET] 30 tablet     Sig: TAKE 1 TABLET BY MOUTH EVERY 8 HOURS AS NEEDED FOR ANXIETY     Not Delegated - Psychiatry: Anxiolytics/Hypnotics 2 Failed - 09/22/2023 11:48 AM      Failed - This refill cannot be delegated      Failed - Urine Drug Screen completed in last 360 days      Passed - Patient is not pregnant      Passed - Valid encounter within last 6 months    Recent Outpatient Visits           5 months ago Pure hypercholesterolemia   [facility] Pickard, Cisco Crest, MD   11 months ago Pure hypercholesterolemia   [facility] Pickard, Cisco Crest, MD   1 year ago Routine general medical examination at a health care facility   [facility] Pickard, Cisco Crest, MD

## 2023-09-24 ENCOUNTER — Other Ambulatory Visit: Payer: Self-pay | Admitting: Family Medicine

## 2023-10-02 ENCOUNTER — Other Ambulatory Visit: Payer: Self-pay | Admitting: Family Medicine

## 2023-10-02 DIAGNOSIS — I10 Essential (primary) hypertension: Secondary | ICD-10-CM

## 2023-12-02 ENCOUNTER — Other Ambulatory Visit: Payer: Self-pay | Admitting: Family Medicine

## 2023-12-02 DIAGNOSIS — E785 Hyperlipidemia, unspecified: Secondary | ICD-10-CM

## 2023-12-02 DIAGNOSIS — I2510 Atherosclerotic heart disease of native coronary artery without angina pectoris: Secondary | ICD-10-CM

## 2023-12-02 DIAGNOSIS — M1990 Unspecified osteoarthritis, unspecified site: Secondary | ICD-10-CM

## 2023-12-18 ENCOUNTER — Telehealth: Payer: Self-pay

## 2023-12-18 NOTE — Telephone Encounter (Signed)
 Copied from CRM [id number]. Topic: Clinical - Lab/Test Results >> Dec 18, 2023 11:24 AM Emylou G wrote: Reason for CRM: Patient would like get labs done.. his phys is 11/24

## 2023-12-26 ENCOUNTER — Other Ambulatory Visit: Payer: Self-pay | Admitting: Family Medicine

## 2023-12-29 NOTE — Telephone Encounter (Signed)
 Requested medications are due for refill today.  yes  Requested medications are on the active medications list.  yes  Last refill. 09/22/2023 #30 1 rf  Future visit scheduled.   yes  Notes to clinic.  Refill not delegated.    Requested Prescriptions  Pending Prescriptions Disp Refills   LORazepam  (ATIVAN ) 0.5 MG tablet [Pharmacy Med Name: LORAZEPAM  0.5 MG TABLET] 30 tablet     Sig: TAKE 1 TABLET BY MOUTH EVERY 8 HOURS AS NEEDED FOR ANXIETY     Not Delegated - Psychiatry: Anxiolytics/Hypnotics 2 Failed - 12/29/2023  8:01 AM      Failed - This refill cannot be delegated      Failed - Urine Drug Screen completed in last 360 days      Failed - Valid encounter within last 6 months    Recent Outpatient Visits           9 months ago Pure hypercholesterolemia   [facility] Pickard, Butler DASEN, MD   1 year ago Pure hypercholesterolemia   [facility] Duanne Butler DASEN, MD   1 year ago Routine general medical examination at a health care facility   [facility] Pickard, Butler DASEN, MD              Passed - Patient is not pregnant

## 2024-02-03 DIAGNOSIS — D485 Neoplasm of uncertain behavior of skin: Secondary | ICD-10-CM | POA: Diagnosis not present

## 2024-02-03 DIAGNOSIS — L570 Actinic keratosis: Secondary | ICD-10-CM | POA: Diagnosis not present

## 2024-03-29 ENCOUNTER — Encounter: Admitting: Family Medicine

## 2024-04-01 ENCOUNTER — Other Ambulatory Visit: Payer: Self-pay | Admitting: Family Medicine

## 2024-04-01 DIAGNOSIS — I10 Essential (primary) hypertension: Secondary | ICD-10-CM

## 2024-04-05 ENCOUNTER — Encounter: Admitting: Family Medicine

## 2024-04-05 ENCOUNTER — Ambulatory Visit: Admitting: Family Medicine

## 2024-04-13 ENCOUNTER — Encounter: Admitting: Family Medicine

## 2024-05-27 ENCOUNTER — Other Ambulatory Visit: Payer: Self-pay | Admitting: Family Medicine

## 2024-05-27 ENCOUNTER — Encounter: Payer: Self-pay | Admitting: Family Medicine

## 2024-05-27 ENCOUNTER — Ambulatory Visit (INDEPENDENT_AMBULATORY_CARE_PROVIDER_SITE_OTHER): Admitting: Family Medicine

## 2024-05-27 VITALS — BP 136/86 | HR 68 | Temp 98.3°F | Ht 74.0 in | Wt 180.0 lb

## 2024-05-27 DIAGNOSIS — I2510 Atherosclerotic heart disease of native coronary artery without angina pectoris: Secondary | ICD-10-CM | POA: Diagnosis not present

## 2024-05-27 DIAGNOSIS — Z0001 Encounter for general adult medical examination with abnormal findings: Secondary | ICD-10-CM

## 2024-05-27 DIAGNOSIS — Z0000 Encounter for general adult medical examination without abnormal findings: Secondary | ICD-10-CM

## 2024-05-27 DIAGNOSIS — L309 Dermatitis, unspecified: Secondary | ICD-10-CM | POA: Insufficient documentation

## 2024-05-27 DIAGNOSIS — D3610 Benign neoplasm of peripheral nerves and autonomic nervous system, unspecified: Secondary | ICD-10-CM | POA: Insufficient documentation

## 2024-05-27 DIAGNOSIS — R413 Other amnesia: Secondary | ICD-10-CM | POA: Diagnosis not present

## 2024-05-27 DIAGNOSIS — Z85828 Personal history of other malignant neoplasm of skin: Secondary | ICD-10-CM | POA: Insufficient documentation

## 2024-05-27 DIAGNOSIS — D225 Melanocytic nevi of trunk: Secondary | ICD-10-CM | POA: Insufficient documentation

## 2024-05-27 DIAGNOSIS — E7800 Pure hypercholesterolemia, unspecified: Secondary | ICD-10-CM | POA: Diagnosis not present

## 2024-05-27 DIAGNOSIS — I10 Essential (primary) hypertension: Secondary | ICD-10-CM

## 2024-05-27 MED ORDER — SILDENAFIL CITRATE 100 MG PO TABS: 50.0000 mg | ORAL_TABLET | Freq: Every day | ORAL | 11 refills | Status: AC | PRN

## 2024-05-27 NOTE — Progress Notes (Signed)
 "  Subjective:    Patient ID: Dave Cox, male    DOB: [birth year], 84 y.o.   MRN: [id number]  HPI  Patient is here today for complete physical exam.  His wife died in 24-Jun-2019 from supranuclear palsy.  Patient has started singing again.  This seems to have brighter spirits.  He continues to use lorazepam  at night.  He is here today with his daughter.  They both report some mild memory issues.  He is not getting lost driving.  He is still driving however it is local only.  He is still managing his finances.  He is not leaving on the water or the stove.  However, he does occasionally repeat himself or forgets conversations.  Patient denies word finding difficulties.  He denies aphasia.  He denies apraxia.  However his daughter states that he has a difficult time using the air fryer even after she reads the directions to him.  He is still able to use the stove in the microwave without difficulty.   His blood pressure today is outstanding.  He denies any chest pain or shortness of breath or dyspnea on exertion.  He has had his flu shot already.  The remainder of his vaccinations are up-to-date.  Given his age, he does not require prostate cancer screening or colon cancer screening. Immunization History  Administered Date(s) Administered   Fluad Quad(high Dose 65+) 02/08/2019, 04/04/2020, 04/12/2021   INFLUENZA, HIGH DOSE SEASONAL PF 02/17/2017, 03/19/2018   Influenza Split 03/04/2011   Influenza,inj,Quad PF,6+ Mos 03/08/2013, 03/13/2015, 04/11/2016   Influenza-Unspecified 02/04/2003, 02/24/2009, 02/28/2010, 03/04/2011, 03/05/2012, 03/09/2012, 02/23/2014   Pneumococcal Conjugate-13 03/10/2014   Pneumococcal Polysaccharide-23 03/04/2011   Td 12/29/2007   Zoster Recombinant(Shingrix) 06/15/2019    Past Medical History:  Diagnosis Date   Abnormal EKG    Anxiety    CAD (coronary artery disease)    Cervical osteoarthritis    COPD (chronic obstructive pulmonary disease) (HCC)    GERD  (gastroesophageal reflux disease)    HTN (hypertension)    Hyperlipidemia    Melanoma (HCC)    eye   Nephrolithiasis    Past Surgical History:  Procedure Laterality Date   BASAL CELL CARCINOMA EXCISION     CARDIAC CATHETERIZATION     HERNIA REPAIR     KIDNEY STONE SURGERY     LUMBAR DISC SURGERY     MELANOMA EXCISION     eye   STRABISMUS SURGERY Bilateral 08/23/2016   Procedure: REPAIR STRABISMUS;  Surgeon: Elsie Salt, MD;  Location: [facility];  Service: Ophthalmology;  Laterality: Bilateral;   STRABISMUS SURGERY Left 2017-06-23   Procedure: STRABISMUS REPAIR;  Surgeon: Salt Elsie, MD;  Location: [facility];  Service: Ophthalmology;  Laterality: Left;   TONSILLECTOMY     Current Outpatient Medications on File Prior to Visit  Medication Sig Dispense Refill   acetaminophen  (TYLENOL ) 500 MG tablet Take 500 mg by mouth every 6 (six) hours as needed.     ascorbic acid (VITAMIN C) 500 MG tablet Take by mouth.     celecoxib  (CELEBREX ) 200 MG capsule TAKE 1 CAPSULE BY MOUTH DAILY 90 capsule 1   LORazepam  (ATIVAN ) 0.5 MG tablet TAKE 1 TABLET BY MOUTH EVERY 8 HOURS AS NEEDED FOR ANXIETY 30 tablet 3   losartan  (COZAAR ) 100 MG tablet TAKE 1 TABLET BY MOUTH DAILY 90 tablet 1   rosuvastatin  (CRESTOR ) 40 MG tablet TAKE 1 TABLET BY MOUTH DAILY 90 tablet 1   triamcinolone  cream (KENALOG )  0.1 % apply topically twice daily FOR 14 DAYS  0   No current facility-administered medications on file prior to visit.   Allergies  Allergen Reactions   Bee Venom    Morphine Sulfate Other (See Comments)   Social History   Socioeconomic History   Marital status: Widowed    Spouse name: Not on file   Number of children: Not on file   Years of education: Not on file   Highest education level: Associate degree: occupational, scientist, product/process development, or vocational program  Occupational History   Not on file  Tobacco Use   Smoking status: Never   Smokeless tobacco: Never  Substance  and Sexual Activity   Alcohol use: No   Drug use: No   Sexual activity: Not on file  Other Topics Concern   Not on file  Social History Narrative   Not on file   Social Drivers of Health   Tobacco Use: Low Risk (05/27/2024)   Patient History    Smoking Tobacco Use: Never    Smokeless Tobacco Use: Never    Passive Exposure: Not on file  Financial Resource Strain: Low Risk (05/25/2024)   Overall Financial Resource Strain (CARDIA)    Difficulty of Paying Living Expenses: Not hard at all  Food Insecurity: No Food Insecurity (05/25/2024)   Epic    Worried About Radiation Protection Practitioner of Food in the Last Year: Never true    Ran Out of Food in the Last Year: Never true  Transportation Needs: No Transportation Needs (05/25/2024)   Epic    Lack of Transportation (Medical): No    Lack of Transportation (Non-Medical): No  Physical Activity: Sufficiently Active (05/25/2024)   Exercise Vital Sign    Days of Exercise per Week: 5 days    Minutes of Exercise per Session: 60 min  Stress: No Stress Concern Present (05/25/2024)   [employer] of Occupational Health - Occupational Stress Questionnaire    Feeling of Stress: Not at all  Social Connections: Moderately Integrated (05/25/2024)   Social Connection and Isolation Panel    Frequency of Communication with Friends and Family: More than three times a week    Frequency of Social Gatherings with Friends and Family: More than three times a week    Attends Religious Services: More than 4 times per year    Active Member of [employer] or Organizations: Yes    Attends Banker Meetings: More than 4 times per year    Marital Status: Widowed  Intimate Partner Violence: Not on file  Depression (PHQ2-9): Low Risk (03/27/2023)   Depression (PHQ2-9)    PHQ-2 Score: 0  Alcohol Screen: Not on file  Housing: Low Risk (05/25/2024)   Epic    Unable to Pay for Housing in the Last Year: No    Number of Times Moved in the Last Year: 0    Homeless in the Last  Year: No  Utilities: Not on file  Health Literacy: Not on file   Family History  Problem Relation Age of Onset   Prostate cancer Father        mets   Arthritis Father    Hypertension Father    Arthritis Mother    Heart disease Mother    Hypertension Mother    Stroke Mother    Lung cancer Brother        was a smoker     Review of Systems  All other systems reviewed and are negative.      Objective:  Physical Exam Vitals reviewed.  Constitutional:      General: He is not in acute distress.    Appearance: He is well-developed. He is not diaphoretic.  HENT:     Head: Normocephalic and atraumatic.     Right Ear: External ear normal.     Left Ear: External ear normal.     Nose: Nose normal.     Mouth/Throat:     Pharynx: No oropharyngeal exudate.  Eyes:     General: No scleral icterus.       Right eye: No discharge.        Left eye: No discharge.     Conjunctiva/sclera: Conjunctivae normal.     Pupils: Pupils are equal, round, and reactive to light.  Neck:     Thyroid : No thyromegaly.     Vascular: No JVD.     Trachea: No tracheal deviation.  Cardiovascular:     Rate and Rhythm: Normal rate and regular rhythm.     Heart sounds: Normal heart sounds. No murmur heard.    No friction rub. No gallop.  Pulmonary:     Effort: Pulmonary effort is normal. No respiratory distress.     Breath sounds: Normal breath sounds. No stridor. No wheezing or rales.  Chest:     Chest wall: No tenderness.  Abdominal:     General: Bowel sounds are normal. There is no distension.     Palpations: Abdomen is soft. There is no mass.     Tenderness: There is no abdominal tenderness. There is no guarding or rebound.  Musculoskeletal:        General: No tenderness. Normal range of motion.     Cervical back: Normal range of motion and neck supple.  Lymphadenopathy:     Cervical: No cervical adenopathy.  Skin:    General: Skin is warm.     Coloration: Skin is not pale.     Findings: No  erythema or rash.  Neurological:     Mental Status: He is alert and oriented to person, place, and time.     Cranial Nerves: No cranial nerve deficit.     Motor: No abnormal muscle tone.     Coordination: Coordination normal.     Deep Tendon Reflexes: Reflexes are normal and symmetric.  Psychiatric:        Behavior: Behavior normal.        Thought Content: Thought content normal.        Judgment: Judgment normal.           Assessment & Plan:  Memory loss - Plan: CBC with Differential/Platelet, Comprehensive metabolic panel with GFR, Lipid panel, Vitamin B12, TSH, AD-DETECT ABETA 42/40 AND [id number] EVALUATION, PLASMA  Benign essential HTN  Coronary artery disease, unspecified vessel or lesion type, unspecified whether angina present, unspecified whether native or transplanted heart  Pure hypercholesterolemia  General medical exam Based on the conversation I had today with the daughter and the patient, I believe the patient may have some age-related cognitive decline.  However I will check a thyroid , as well as a B12.  They also would like to proceed with the beta tau 217 protein assay to evaluate for the probability of Alzheimer's disease.  If significantly elevated, consider starting Aricept.  Blood pressure is well-controlled.  I would like to see his LDL cholesterol less than 70.  Immunizations are up-to-date. "

## 2024-05-28 ENCOUNTER — Ambulatory Visit: Payer: Self-pay | Admitting: Family Medicine

## 2024-05-28 LAB — COMPREHENSIVE METABOLIC PANEL WITH GFR
AG Ratio: 2 (calc) (ref 1.0–2.5)
ALT: 34 U/L (ref 9–46)
AST: 33 U/L (ref 10–35)
Albumin: 4.7 g/dL (ref 3.6–5.1)
Alkaline phosphatase (APISO): 63 U/L (ref 35–144)
BUN: 20 mg/dL (ref 7–25)
CO2: 29 mmol/L (ref 20–32)
Calcium: 10 mg/dL (ref 8.6–10.3)
Chloride: 103 mmol/L (ref 98–110)
Creat: 1.06 mg/dL (ref 0.70–1.22)
Globulin: 2.4 g/dL (ref 1.9–3.7)
Glucose, Bld: 94 mg/dL (ref 65–99)
Potassium: 4.7 mmol/L (ref 3.5–5.3)
Sodium: 140 mmol/L (ref 135–146)
Total Bilirubin: 1.5 mg/dL — ABNORMAL HIGH (ref 0.2–1.2)
Total Protein: 7.1 g/dL (ref 6.1–8.1)
eGFR: 70 mL/min/1.73m2

## 2024-05-28 LAB — CBC WITH DIFFERENTIAL/PLATELET
Absolute Lymphocytes: 2312 {cells}/uL (ref 850–3900)
Absolute Monocytes: 918 {cells}/uL (ref 200–950)
Basophils Absolute: 27 {cells}/uL (ref 0–200)
Basophils Relative: 0.4 %
Eosinophils Absolute: 308 {cells}/uL (ref 15–500)
Eosinophils Relative: 4.6 %
HCT: 48.8 % (ref 39.4–51.1)
Hemoglobin: 16.2 g/dL (ref 13.2–17.1)
MCH: 31.6 pg (ref 27.0–33.0)
MCHC: 33.2 g/dL (ref 31.6–35.4)
MCV: 95.3 fL (ref 81.4–101.7)
MPV: 12.4 fL (ref 7.5–12.5)
Monocytes Relative: 13.7 %
Neutro Abs: 3136 {cells}/uL (ref 1500–7800)
Neutrophils Relative %: 46.8 %
Platelets: 141 Thousand/uL (ref 140–400)
RBC: 5.12 Million/uL (ref 4.20–5.80)
RDW: 11.5 % (ref 11.0–15.0)
Total Lymphocyte: 34.5 %
WBC: 6.7 Thousand/uL (ref 3.8–10.8)

## 2024-05-28 LAB — VITAMIN B12: Vitamin B-12: 545 pg/mL (ref 200–1100)

## 2024-05-28 LAB — TSH: TSH: 1.28 m[IU]/L (ref 0.40–4.50)

## 2024-05-28 LAB — LIPID PANEL
Cholesterol: 96 mg/dL
HDL: 44 mg/dL
LDL Cholesterol (Calc): 38 mg/dL
Non-HDL Cholesterol (Calc): 52 mg/dL
Total CHOL/HDL Ratio: 2.2 (calc)
Triglycerides: 63 mg/dL

## 2024-05-30 ENCOUNTER — Other Ambulatory Visit: Payer: Self-pay | Admitting: Family Medicine

## 2024-05-30 DIAGNOSIS — I2510 Atherosclerotic heart disease of native coronary artery without angina pectoris: Secondary | ICD-10-CM

## 2024-05-30 DIAGNOSIS — M1990 Unspecified osteoarthritis, unspecified site: Secondary | ICD-10-CM

## 2024-05-30 DIAGNOSIS — E785 Hyperlipidemia, unspecified: Secondary | ICD-10-CM

## 2024-05-31 NOTE — Telephone Encounter (Signed)
 Requested Prescriptions  Pending Prescriptions Disp Refills   celecoxib  (CELEBREX ) 200 MG capsule [Pharmacy Med Name: CELECOXIB  200 MG CAPSULE] 90 capsule 3    Sig: TAKE 1 CAPSULE BY MOUTH DAILY     Analgesics:  COX2 Inhibitors Failed - 05/31/2024  2:07 PM      Failed - Manual Review: Labs are only required if the patient has taken medication for more than 8 weeks.      Passed - HGB in normal range and within 360 days    Hemoglobin  Date Value Ref Range Status  05/27/2024 16.2 13.2 - 17.1 g/dL Final         Passed - Cr in normal range and within 360 days    Creat  Date Value Ref Range Status  05/27/2024 1.06 0.70 - 1.22 mg/dL Final         Passed - HCT in normal range and within 360 days    HCT  Date Value Ref Range Status  05/27/2024 48.8 39.4 - 51.1 % Final         Passed - AST in normal range and within 360 days    AST  Date Value Ref Range Status  05/27/2024 33 10 - 35 U/L Final         Passed - ALT in normal range and within 360 days    ALT  Date Value Ref Range Status  05/27/2024 34 9 - 46 U/L Final         Passed - eGFR is 30 or above and within 360 days    GFR, Est African American  Date Value Ref Range Status  04/04/2020 68 > OR = 60 mL/min/1.51m2 Final   GFR, Est Non African American  Date Value Ref Range Status  04/04/2020 58 (L) > OR = 60 mL/min/1.70m2 Final   GFR  Date Value Ref Range Status  10/14/2013 78.81 >60.00 mL/min Final   eGFR  Date Value Ref Range Status  05/27/2024 70 > OR = 60 mL/min/1.46m2 Final         Passed - Patient is not pregnant      Passed - Valid encounter within last 12 months    Recent Outpatient Visits           4 days ago Memory loss   [place] [facility] Pickard, Butler DASEN, MD   1 year ago Pure hypercholesterolemia   [place] [facility] Pickard, Butler DASEN, MD   1 year ago Pure hypercholesterolemia   [place] [facility] Duanne, Butler DASEN, MD   2 years  ago Routine general medical examination at a health care facility   [facility] Duanne Butler DASEN, MD               rosuvastatin  (CRESTOR ) 40 MG tablet [Pharmacy Med Name: ROSUVASTATIN  CALCIUM  40 MG TAB] 90 tablet 3    Sig: TAKE 1 TABLET BY MOUTH DAILY     Cardiovascular:  Antilipid - Statins 2 Failed - 05/31/2024  2:07 PM      Failed - Lipid Panel in normal range within the last 12 months    Cholesterol  Date Value Ref Range Status  05/27/2024 96 <200 mg/dL Final   LDL Cholesterol (Calc)  Date Value Ref Range Status  05/27/2024 38 mg/dL (calc) Final    Comment:    Reference range: <100 . Desirable range <100 mg/dL for primary prevention;   <70 mg/dL for  patients with CHD or diabetic patients  with > or = 2 CHD risk factors. SABRA LDL-C is now calculated using the Martin-Hopkins  calculation, which is a validated novel method providing  better accuracy than the Friedewald equation in the  estimation of LDL-C.  Gladis APPLETHWAITE et al. SANDREA. 7986;689(80): 2061-2068  (http://education.[facility].com/faq/FAQ164)    HDL  Date Value Ref Range Status  05/27/2024 44 > OR = 40 mg/dL Final   Triglycerides  Date Value Ref Range Status  05/27/2024 63 <150 mg/dL Final         Passed - Cr in normal range and within 360 days    Creat  Date Value Ref Range Status  05/27/2024 1.06 0.70 - 1.22 mg/dL Final         Passed - Patient is not pregnant      Passed - Valid encounter within last 12 months    Recent Outpatient Visits           4 days ago Memory loss   [place] [facility] Pickard, Butler DASEN, MD   1 year ago Pure hypercholesterolemia   [place] [facility] Pickard, Butler DASEN, MD   1 year ago Pure hypercholesterolemia   [place] [facility] Duanne Butler DASEN, MD   2 years ago Routine general medical examination at a health care facility   [facility]  Pickard, Butler DASEN, MD

## 2024-06-04 LAB — AD-DETECT ABETA 42/40 AND P-TAU217 EVALUATION, PLASMA
ABETA 40: 235 pg/mL
ABETA 42/40 RATIO: 0.179
ABETA 42: 42 pg/mL
AD DETECT PTAU217: 0.16 pg/mL
LIKELIHOOD SCORE: 0.0634

## 2025-06-02 ENCOUNTER — Encounter: Admitting: Family Medicine
